# Patient Record
Sex: Male | Born: 1935 | Race: White | Hispanic: No | Marital: Married | Smoking: Former smoker
Health system: Southern US, Community
[De-identification: ages and names within clinical notes are randomized; demographics above are authoritative.]

## PROBLEM LIST (undated history)

## (undated) DIAGNOSIS — I509 Heart failure, unspecified: Secondary | ICD-10-CM

## (undated) DIAGNOSIS — R609 Edema, unspecified: Secondary | ICD-10-CM

## (undated) DIAGNOSIS — I4891 Unspecified atrial fibrillation: Secondary | ICD-10-CM

## (undated) DIAGNOSIS — I359 Nonrheumatic aortic valve disorder, unspecified: Secondary | ICD-10-CM

## (undated) DIAGNOSIS — I10 Essential (primary) hypertension: Secondary | ICD-10-CM

## (undated) DIAGNOSIS — C3490 Malignant neoplasm of unspecified part of unspecified bronchus or lung: Secondary | ICD-10-CM

## (undated) DIAGNOSIS — D693 Immune thrombocytopenic purpura: Secondary | ICD-10-CM

## (undated) DIAGNOSIS — I2510 Atherosclerotic heart disease of native coronary artery without angina pectoris: Secondary | ICD-10-CM

## (undated) DIAGNOSIS — C61 Malignant neoplasm of prostate: Secondary | ICD-10-CM

## (undated) DIAGNOSIS — E785 Hyperlipidemia, unspecified: Secondary | ICD-10-CM

## (undated) HISTORY — DX: Nonrheumatic aortic valve disorder, unspecified: I35.9

## (undated) HISTORY — DX: Heart failure, unspecified: I50.9

## (undated) HISTORY — DX: Immune thrombocytopenic purpura: D69.3

## (undated) HISTORY — PX: PNEUMONECTOMY: SHX168

## (undated) HISTORY — PX: CARPAL TUNNEL RELEASE: SHX101

## (undated) HISTORY — DX: Unspecified atrial fibrillation: I48.91

## (undated) HISTORY — DX: Essential (primary) hypertension: I10

## (undated) HISTORY — DX: Malignant neoplasm of unspecified part of unspecified bronchus or lung: C34.90

## (undated) HISTORY — DX: Hyperlipidemia, unspecified: E78.5

## (undated) HISTORY — DX: Edema, unspecified: R60.9

## (undated) HISTORY — PX: PROSTATECTOMY: SHX69

## (undated) HISTORY — DX: Atherosclerotic heart disease of native coronary artery without angina pectoris: I25.10

---

## 1998-09-21 ENCOUNTER — Encounter: Payer: Self-pay | Admitting: Family Medicine

## 1998-09-21 ENCOUNTER — Ambulatory Visit (HOSPITAL_COMMUNITY): Admission: RE | Admit: 1998-09-21 | Discharge: 1998-09-21 | Payer: Self-pay | Admitting: Family Medicine

## 1999-03-07 ENCOUNTER — Encounter: Payer: Self-pay | Admitting: Thoracic Surgery

## 1999-03-07 ENCOUNTER — Encounter: Admission: RE | Admit: 1999-03-07 | Discharge: 1999-03-07 | Payer: Self-pay | Admitting: Thoracic Surgery

## 1999-04-22 ENCOUNTER — Other Ambulatory Visit: Admission: RE | Admit: 1999-04-22 | Discharge: 1999-04-22 | Payer: Self-pay | Admitting: Urology

## 1999-07-08 ENCOUNTER — Encounter: Payer: Self-pay | Admitting: Thoracic Surgery

## 1999-07-08 ENCOUNTER — Encounter: Admission: RE | Admit: 1999-07-08 | Discharge: 1999-07-08 | Payer: Self-pay | Admitting: Thoracic Surgery

## 2000-01-04 ENCOUNTER — Encounter: Payer: Self-pay | Admitting: Thoracic Surgery

## 2000-01-04 ENCOUNTER — Encounter: Admission: RE | Admit: 2000-01-04 | Discharge: 2000-01-04 | Payer: Self-pay | Admitting: Thoracic Surgery

## 2004-10-21 ENCOUNTER — Encounter: Admission: RE | Admit: 2004-10-21 | Discharge: 2004-10-21 | Payer: Self-pay | Admitting: Family Medicine

## 2004-11-14 ENCOUNTER — Ambulatory Visit (HOSPITAL_COMMUNITY): Admission: RE | Admit: 2004-11-14 | Discharge: 2004-11-14 | Payer: Self-pay | Admitting: Family Medicine

## 2004-12-01 ENCOUNTER — Inpatient Hospital Stay (HOSPITAL_COMMUNITY): Admission: RE | Admit: 2004-12-01 | Discharge: 2004-12-14 | Payer: Self-pay | Admitting: Urology

## 2004-12-01 ENCOUNTER — Ambulatory Visit: Payer: Self-pay | Admitting: Internal Medicine

## 2004-12-01 ENCOUNTER — Encounter (INDEPENDENT_AMBULATORY_CARE_PROVIDER_SITE_OTHER): Payer: Self-pay | Admitting: *Deleted

## 2004-12-05 ENCOUNTER — Ambulatory Visit: Payer: Self-pay | Admitting: Cardiology

## 2004-12-06 ENCOUNTER — Encounter: Payer: Self-pay | Admitting: Cardiology

## 2005-07-30 IMAGING — CR DG ABDOMEN ACUTE W/ 1V CHEST
4 series · 4 of 4 positions shown · non-contrast
Comparison: none

CLINICAL DATA: BPH.  Bladder outlet obstruction.  Nausea and vomiting. 
ACUTE ABDOMEN - 2 VIEW WITH CHEST - 1 VIEW:
Supine and upright views show nasogastric tube in the fundus, coiled.  There is gaseous distention of the colon, particularly in the right colon and transverse colon.  There is a catheter projecting over the central pelvis, presumably related to the urinary tract. Small bowel gas pattern is unremarkable.  No worrisome calcifications. Ordinary degenerative changes affect the lumbar spine. 
The patient has had previous pneumonectomy on the right.  Opacification of the right hemithorax is seen, as expected.  The left chest is clear. No sign of free air.

[view not recorded (1 of 4)]
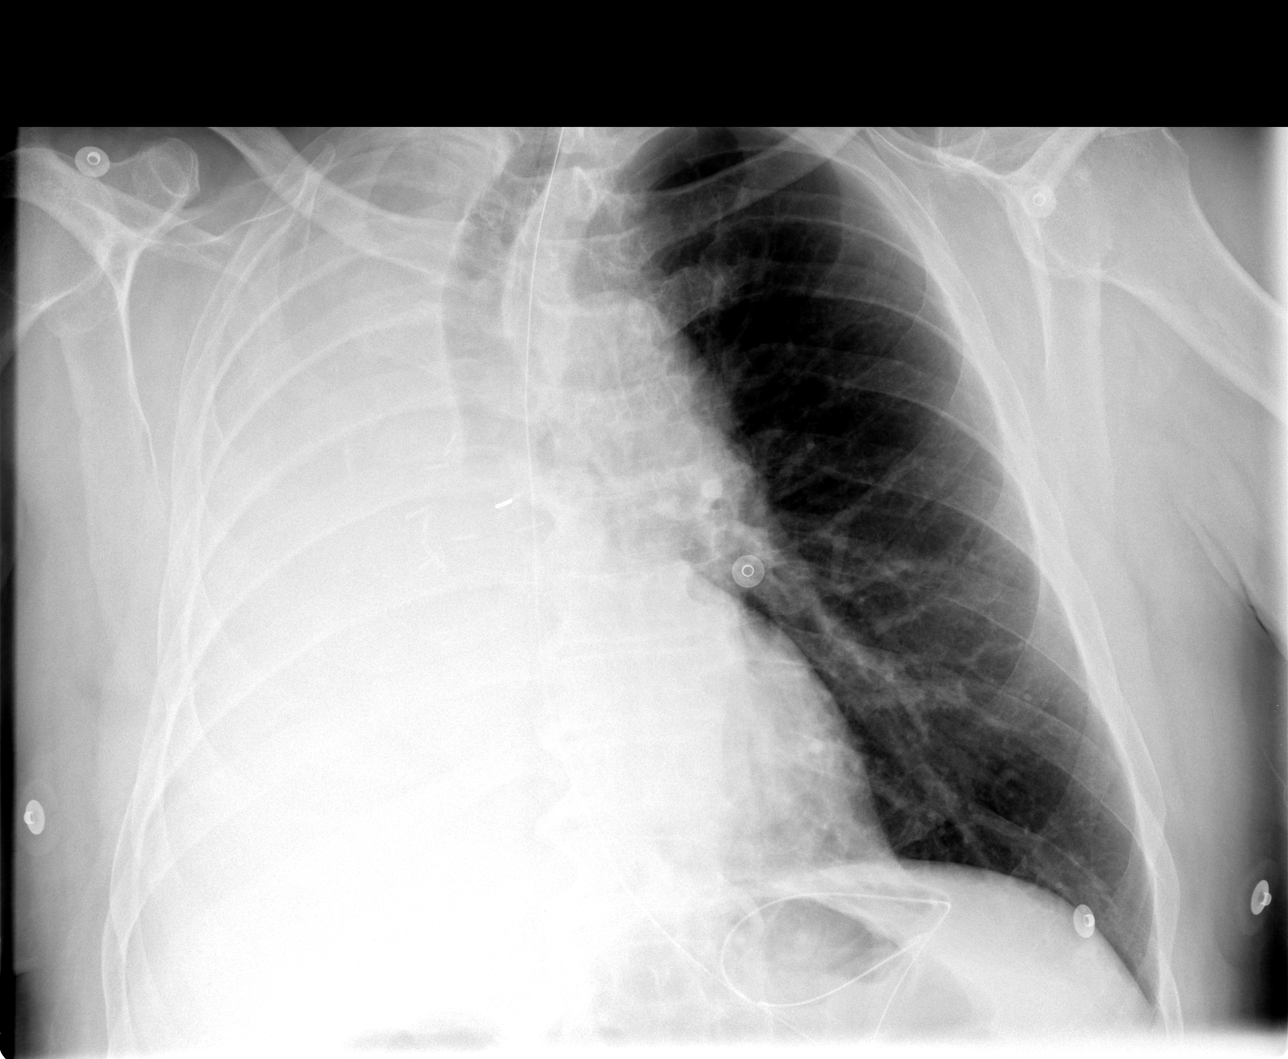

[view not recorded (2 of 4)]
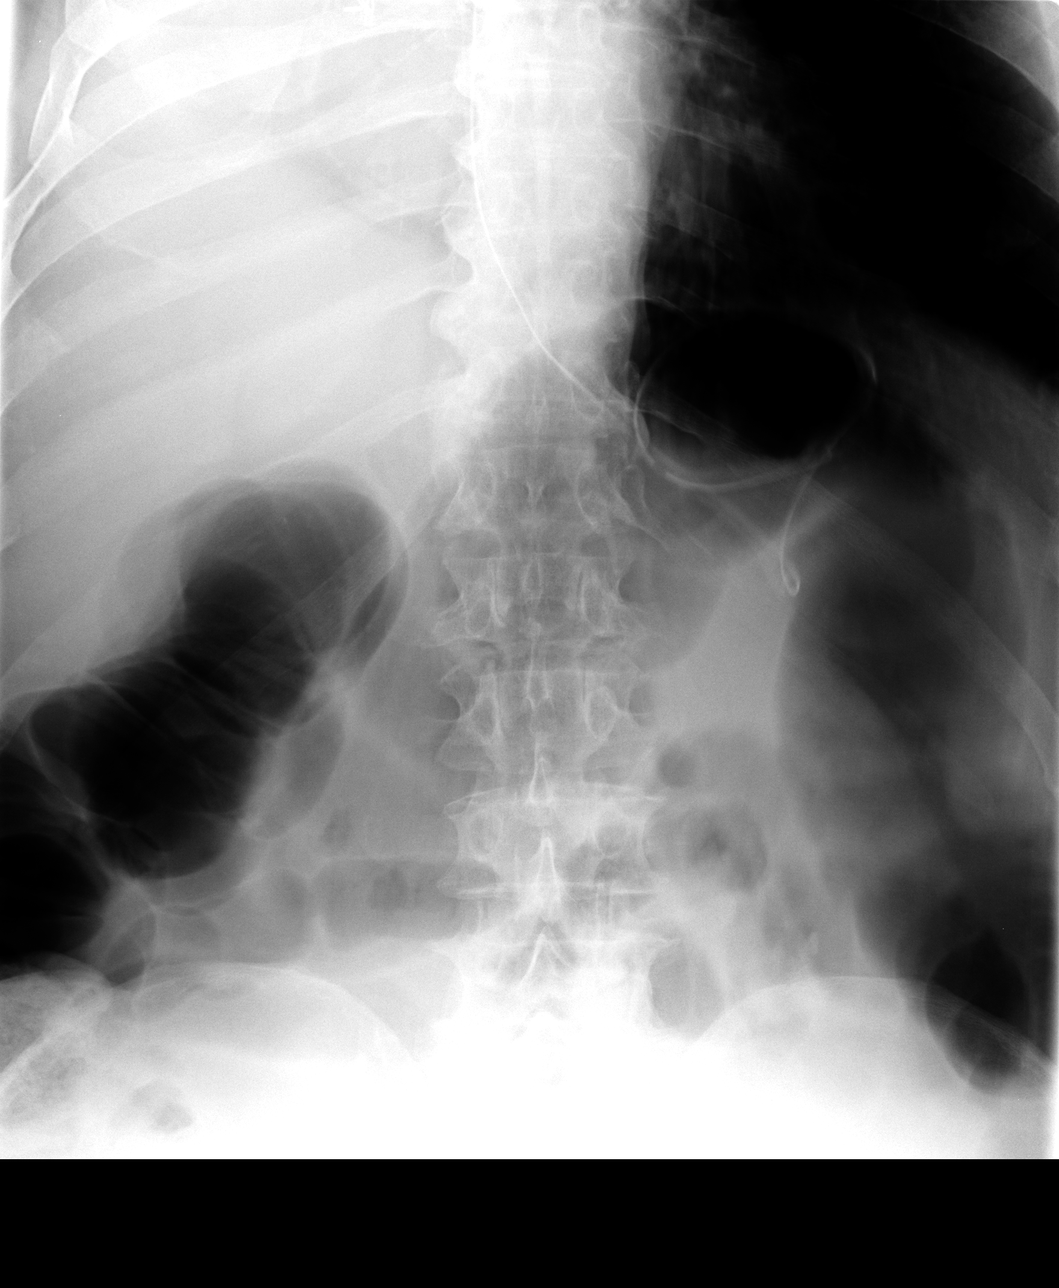

[view not recorded (3 of 4)]
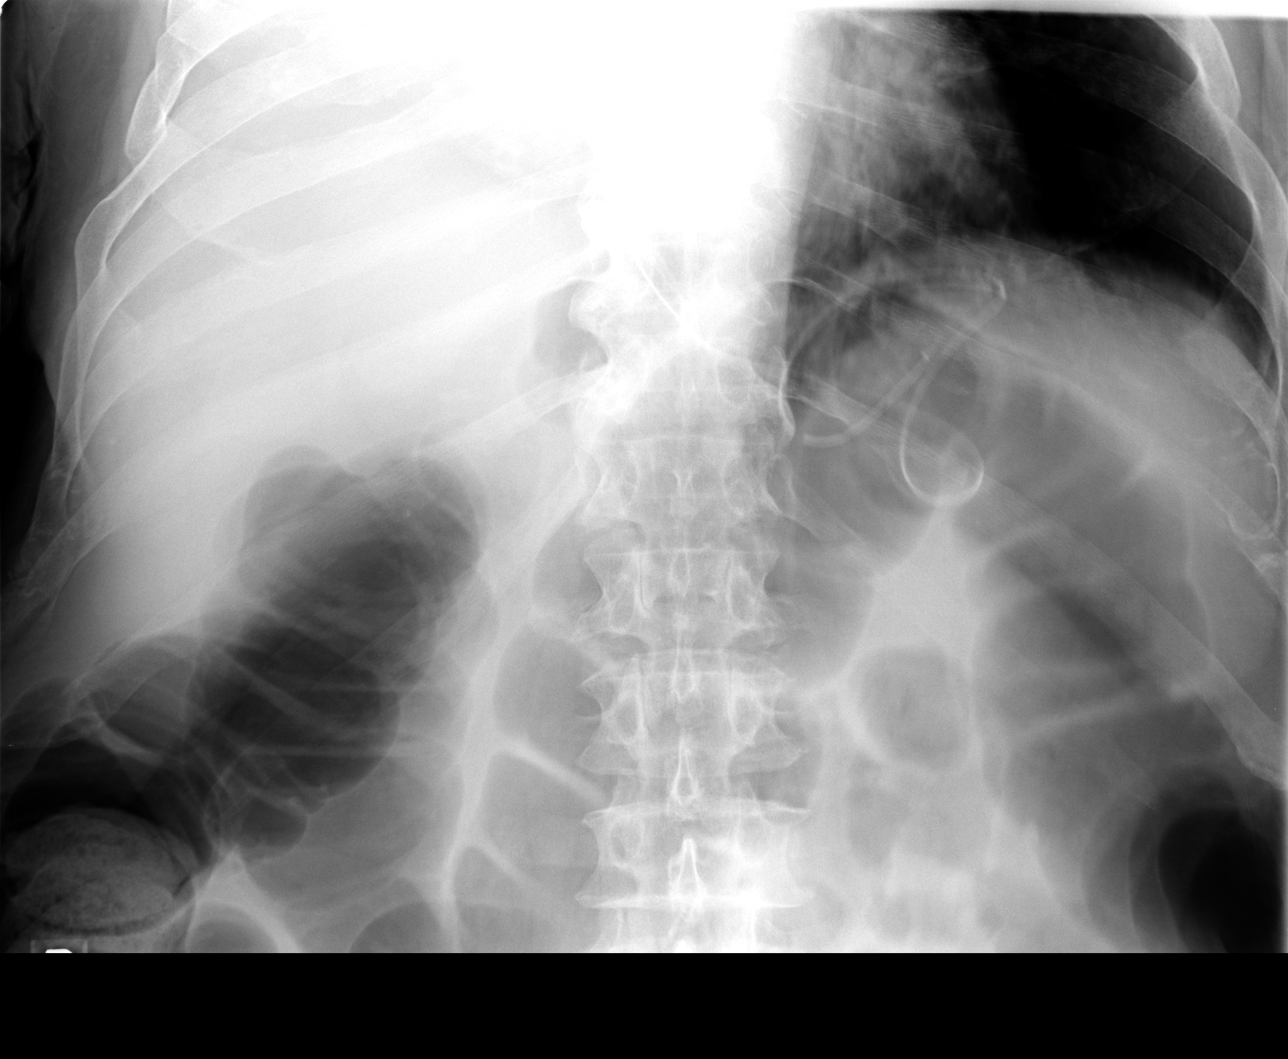

[view not recorded (4 of 4)]
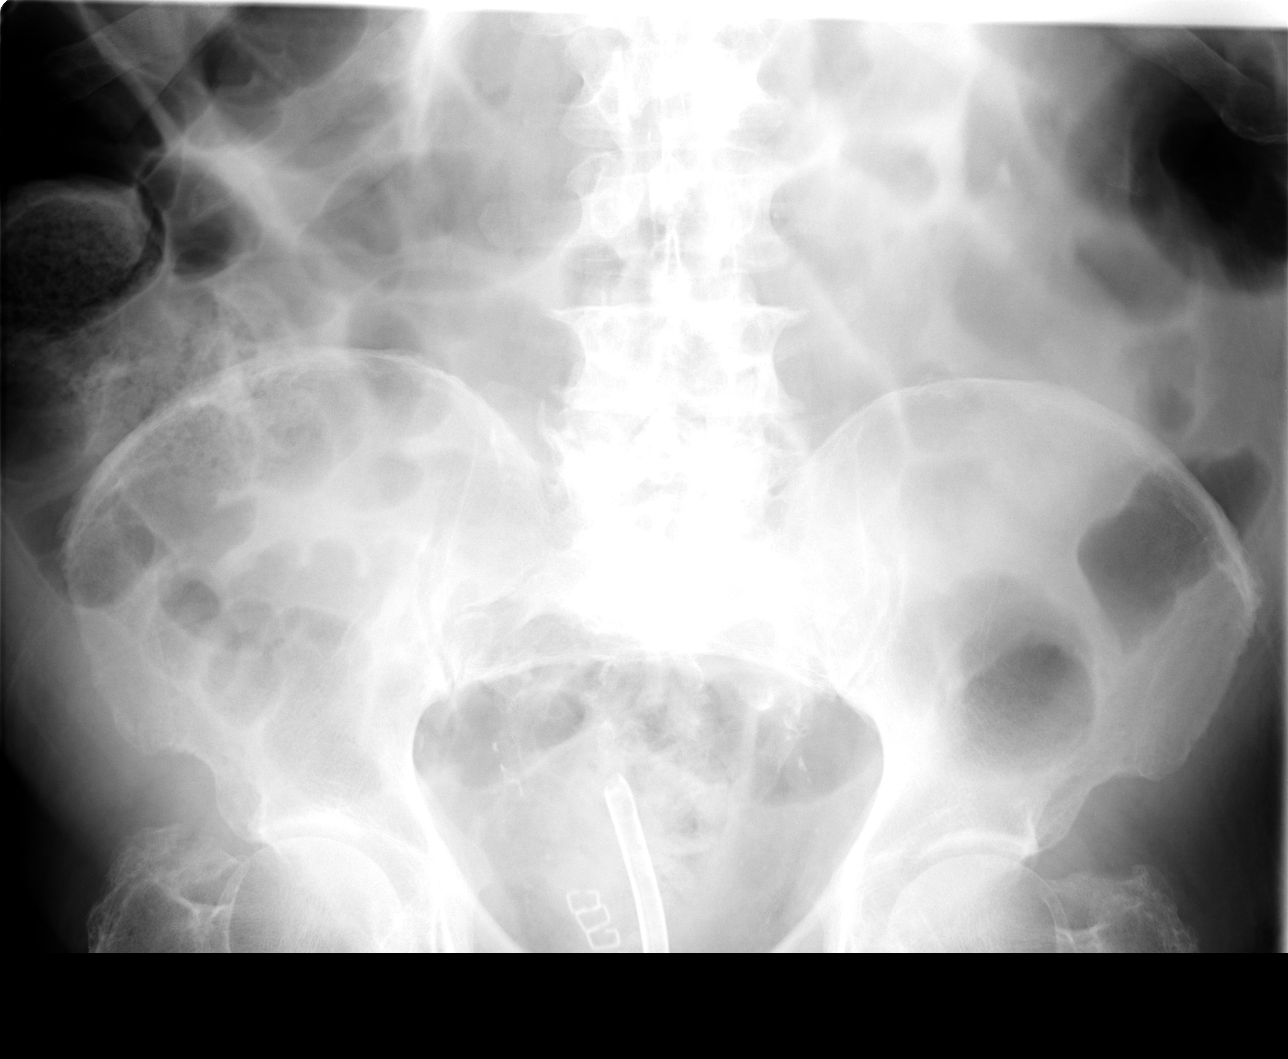

[4 of 4 positions shown; findings below may reference images not displayed]

IMPRESSION: 1.  Increased gas in the colon.  Possible colonic ileus. 
2.  Expected right pneumonectomy changes.

## 2005-08-04 IMAGING — CT CT CHEST W/ CM
1 of 2 series · 15 of 32 positions shown, 19 images · IV contrast (omnipaque)
Comparison: none

CLINICAL DATA: Fever of unknown origin.  Previous right pneumonectomy for squamous cell carcinoma.  Abnormal chest radiograph. 
 CHEST CT WITH CONTRAST:
TECHNIQUE: Multidetector CT imaging of the chest was performed following the standard protocol during bolus administration of intravenous contrast.
 Contrast:  80 cc Omnipaque 300
 Postsurgical changes are seen from right pneumonectomy with fluid filling the right pneumonectomy space.  There is no evidence of soft tissue mass in the right hemithorax or involving the right chest wall.  
 A small left pleural effusion is present as well as compensatory hyperinflation of the left lung.  The left lung is clear.  There is no evidence of pulmonary infiltrate or mass.  
 There is no evidence of hilar or mediastinal masses.  A 9 mm mediastinal lymph node is noted in the right paratracheal region.  Coronary artery calcification is noted.  
 Images obtained through the upper abdomen show the presence of small calcified gallstones but are otherwise unremarkable.

[Series 2: chest_routine 5.0 b40f st · axial · 0.78mm/px · z∈[-408,-83]mm · 15 of 75 slices shown, 19 images]
[im 5/75  mediastinal]
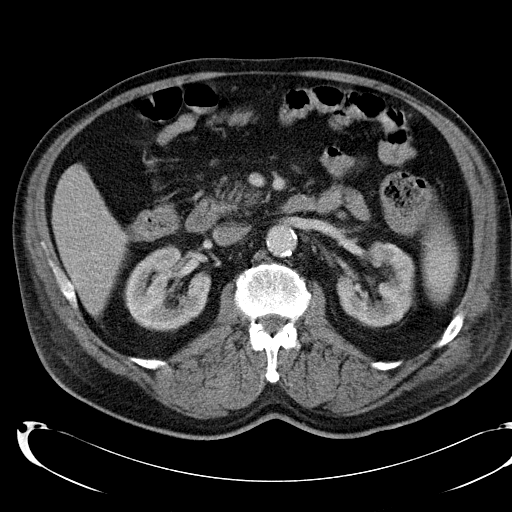
[im 5/75  lung]
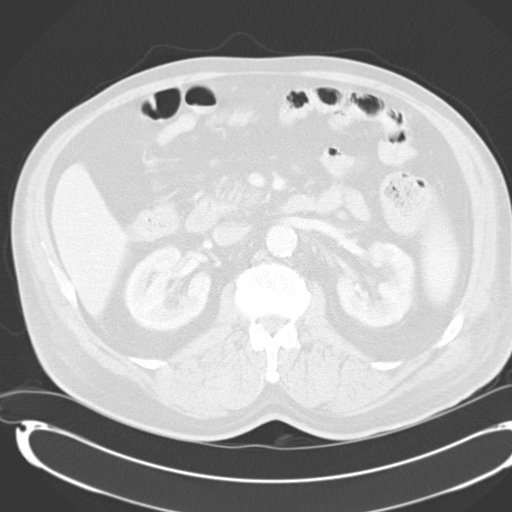
[im 10/75  lung]
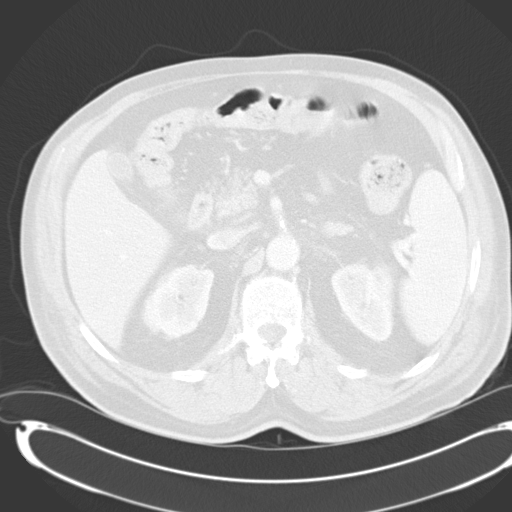
[im 15/75  lung]
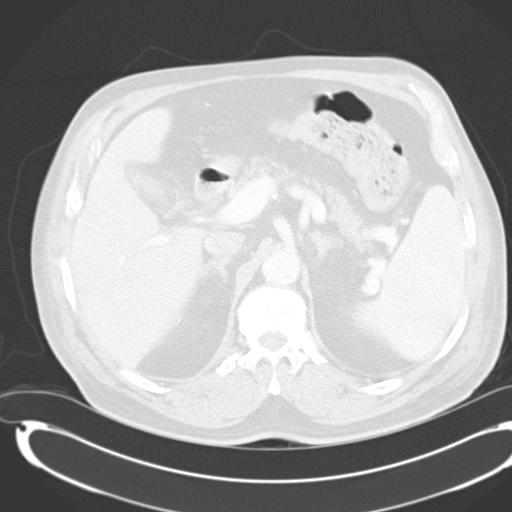
[im 20/75  lung]
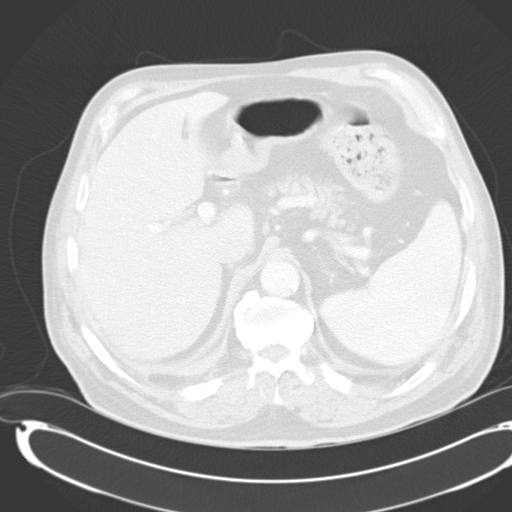
[im 25/75  mediastinal]
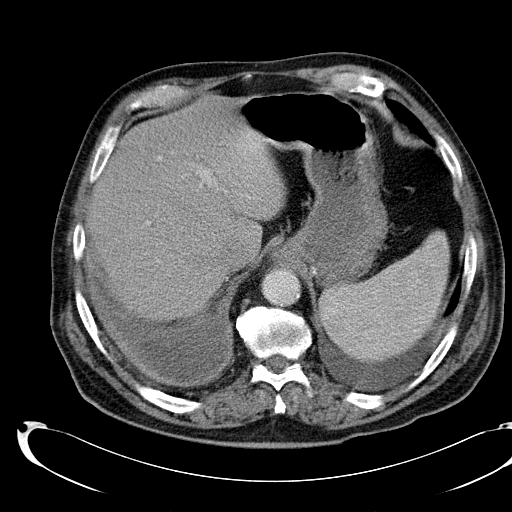
[im 25/75  lung]
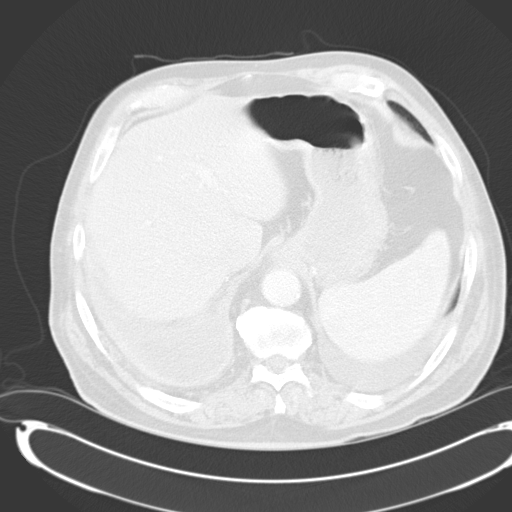
[im 30/75  lung]
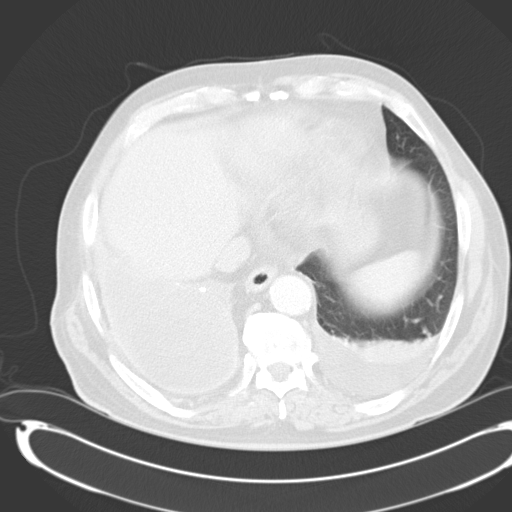
[im 35/75  lung]
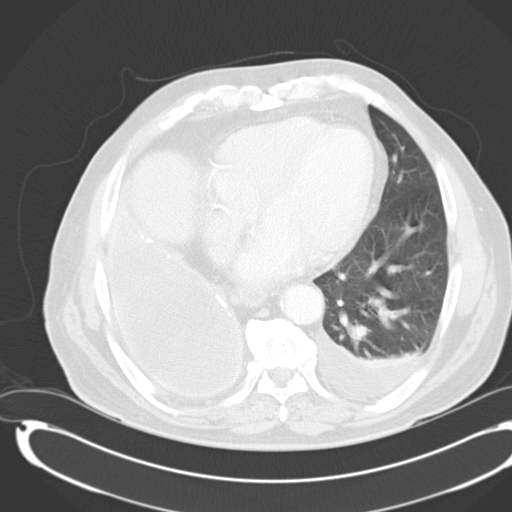
[im 38/75  lung]
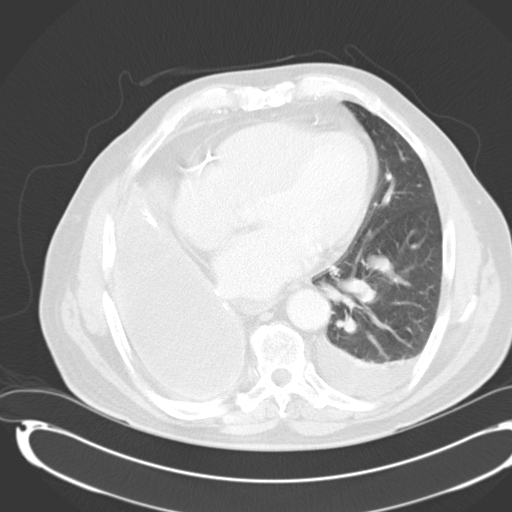
[im 40/75  mediastinal]
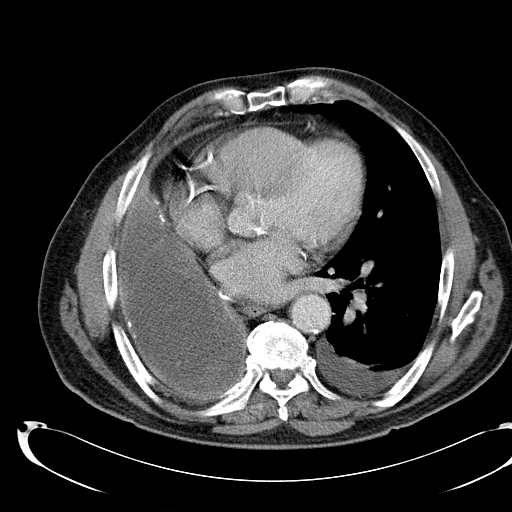
[im 40/75  lung]
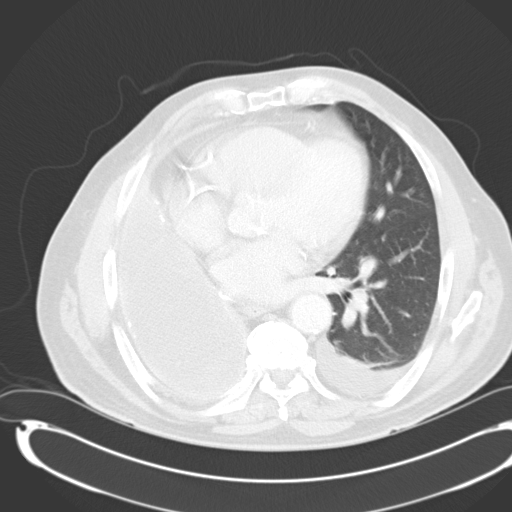
[im 45/75  lung]
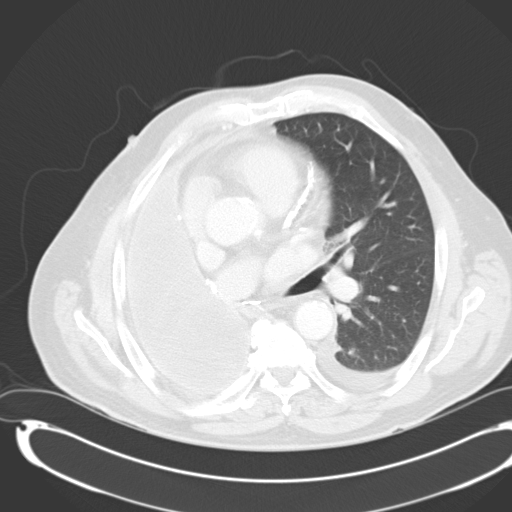
[im 50/75  lung]
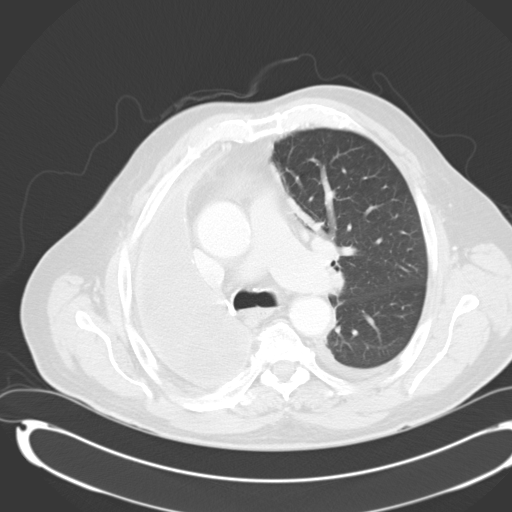
[im 55/75  lung]
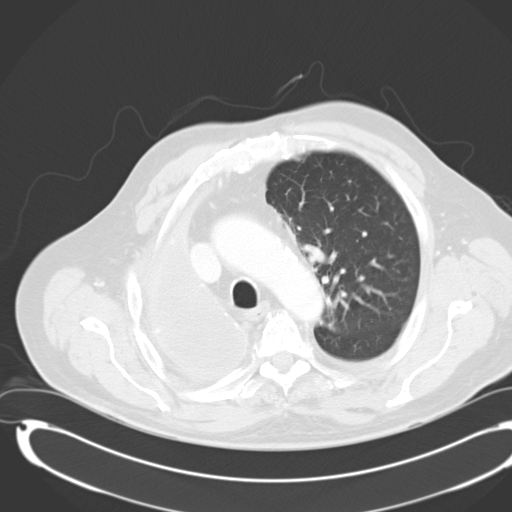
[im 60/75  mediastinal]
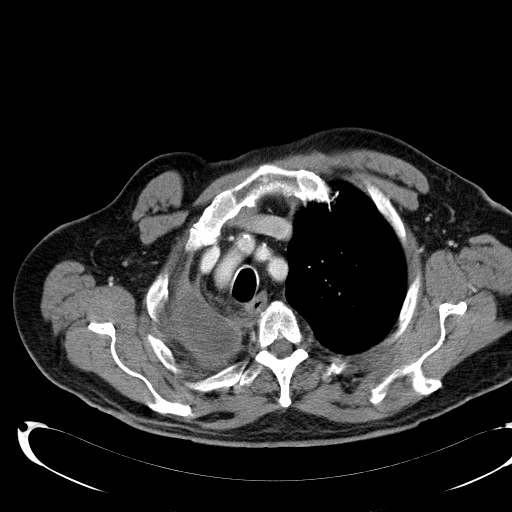
[im 60/75  lung]
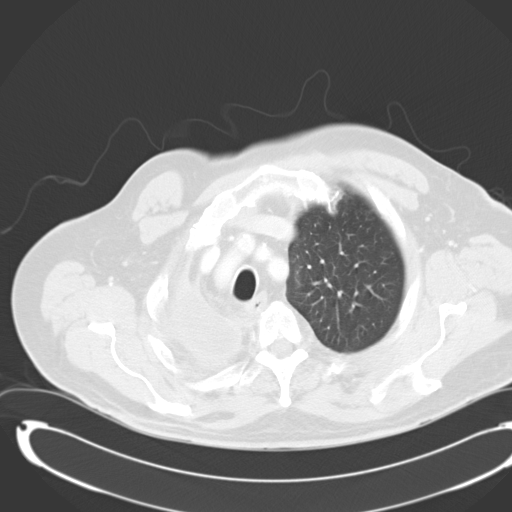
[im 65/75  lung]
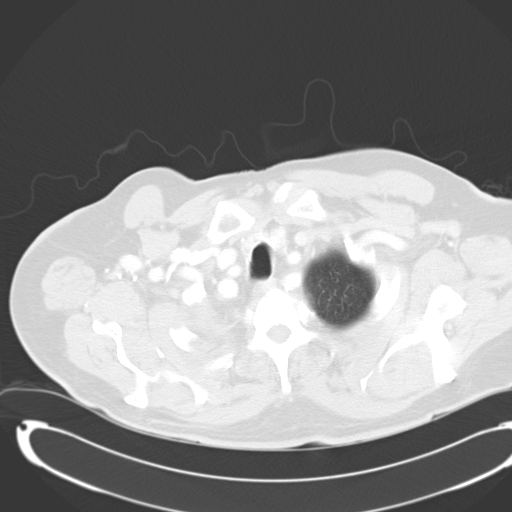
[im 70/75  lung]
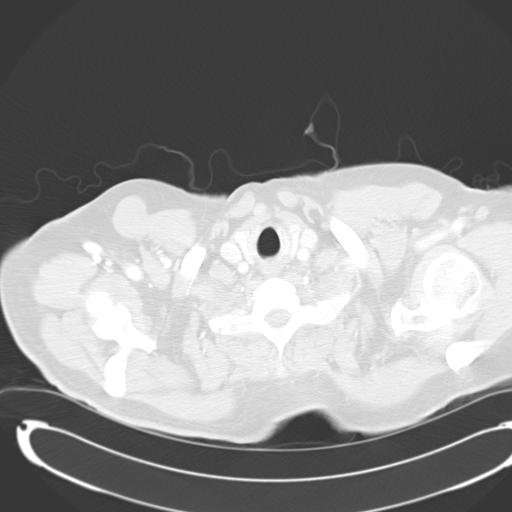

[15 of 32 positions shown; findings below may reference images not displayed]

IMPRESSION: 1.  Postoperative changes from previous right pneumonectomy. 
 2.  Small left pleural effusion of uncertain etiology.  
 3.  No definite evidence of recurrent or metastatic tumor or other acute findings.  
 4.  Cholelithiasis again noted.

## 2005-09-04 ENCOUNTER — Observation Stay (HOSPITAL_COMMUNITY): Admission: EM | Admit: 2005-09-04 | Discharge: 2005-09-06 | Payer: Self-pay | Admitting: Emergency Medicine

## 2009-05-28 DIAGNOSIS — E785 Hyperlipidemia, unspecified: Secondary | ICD-10-CM

## 2009-05-28 DIAGNOSIS — M129 Arthropathy, unspecified: Secondary | ICD-10-CM

## 2009-05-28 DIAGNOSIS — I10 Essential (primary) hypertension: Secondary | ICD-10-CM | POA: Insufficient documentation

## 2009-05-28 DIAGNOSIS — I4891 Unspecified atrial fibrillation: Secondary | ICD-10-CM | POA: Insufficient documentation

## 2009-05-28 DIAGNOSIS — I2510 Atherosclerotic heart disease of native coronary artery without angina pectoris: Secondary | ICD-10-CM

## 2009-06-09 ENCOUNTER — Ambulatory Visit: Payer: Self-pay | Admitting: Cardiology

## 2009-06-09 DIAGNOSIS — I5022 Chronic systolic (congestive) heart failure: Secondary | ICD-10-CM | POA: Insufficient documentation

## 2009-06-28 ENCOUNTER — Ambulatory Visit: Payer: Self-pay | Admitting: Internal Medicine

## 2009-06-28 ENCOUNTER — Ambulatory Visit (HOSPITAL_COMMUNITY): Admission: RE | Admit: 2009-06-28 | Discharge: 2009-06-28 | Payer: Self-pay | Admitting: Cardiology

## 2009-06-28 ENCOUNTER — Encounter: Payer: Self-pay | Admitting: Cardiology

## 2009-06-28 ENCOUNTER — Ambulatory Visit: Payer: Self-pay

## 2009-07-12 ENCOUNTER — Encounter: Payer: Self-pay | Admitting: Cardiology

## 2009-07-14 ENCOUNTER — Ambulatory Visit: Payer: Self-pay | Admitting: Cardiology

## 2009-07-14 DIAGNOSIS — I359 Nonrheumatic aortic valve disorder, unspecified: Secondary | ICD-10-CM | POA: Insufficient documentation

## 2009-07-14 DIAGNOSIS — R609 Edema, unspecified: Secondary | ICD-10-CM

## 2009-10-06 ENCOUNTER — Telehealth (INDEPENDENT_AMBULATORY_CARE_PROVIDER_SITE_OTHER): Payer: Self-pay | Admitting: *Deleted

## 2010-05-31 ENCOUNTER — Encounter
Admission: RE | Admit: 2010-05-31 | Discharge: 2010-05-31 | Payer: Self-pay | Source: Home / Self Care | Attending: Orthopedic Surgery | Admitting: Orthopedic Surgery

## 2010-06-01 LAB — BASIC METABOLIC PANEL
BUN: 10 mg/dL (ref 6–23)
CO2: 31 mEq/L (ref 19–32)
Calcium: 9.8 mg/dL (ref 8.4–10.5)
Chloride: 99 mEq/L (ref 96–112)
Creatinine, Ser: 0.97 mg/dL (ref 0.4–1.5)
GFR calc Af Amer: >60 mL/min (ref >60)
GFR calc non Af Amer: >60 mL/min (ref >60)
Glucose, Bld: 128 mg/dL — ABNORMAL HIGH (ref 70–99)
Potassium: 4.9 mEq/L (ref 3.5–5.1)
Sodium: 140 mEq/L (ref 135–145)

## 2010-06-03 ENCOUNTER — Ambulatory Visit
Admission: RE | Admit: 2010-06-03 | Discharge: 2010-06-03 | Payer: Self-pay | Source: Home / Self Care | Attending: Orthopedic Surgery | Admitting: Orthopedic Surgery

## 2010-06-06 LAB — POCT HEMOGLOBIN-HEMACUE: Hemoglobin: 14.2 g/dL (ref 13.0–17.0)

## 2010-06-10 NOTE — Op Note (Signed)
NAME:  Craig Dean, Craig Dean NO.:  [id number]  MEDICAL RECORD NO.:  [id number]          PATIENT TYPE:  AMB  LOCATION:  DSC                          FACILITY:  [facility]  PHYSICIAN:  Katy Fitch. Sypher, M.D. DATE OF BIRTH:  [birth year]  DATE OF PROCEDURE:  06/03/2010 DATE OF DISCHARGE:                              OPERATIVE REPORT   PREOPERATIVE DIAGNOSIS:  Severe left carpal tunnel syndrome, chronic  POSTOPERATIVE DIAGNOSIS:  Severe left carpal tunnel syndrome, chronic  OPERATIONS:  Release of left transverse carpal ligament.  OPERATING SURGEON:  Katy Fitch. Sypher, MD  ASSISTANT:  Marveen Reeks Dasnoit, PA-C  ANESTHESIA:  General by LMA.  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 Rudi Heap of [place], [place] for management of bilateral carpal tunnel syndrome.  Craig Dean has a past medical history with multiple issues including right pneumonectomy for lung cancer 1998, prostatectomy 2006, history of pilonidal cyst x2.  He is a former smoker, quitting in 1984.  He was referred due to hand numbness for electrodiagnostic studies at [facility] on May 05, 2010.  These revealed evidence of severe bilateral carpal tunnel syndrome, left worse than right.  He was subsequently referred for an upper extremity orthopedic consult. Clinical examination revealed signs of advanced carpal tunnel syndrome with a mild thenar atrophy.  He had diminished sweating in the median distribution.  We advised him to proceed with release of the left transverse carpal ligament under general or regional anesthesia.  He had a detailed consult with Dr. Gypsy Balsam, and was advised to proceed with general anesthesia by LMA technique.  After informed consent with Dr. Gypsy Balsam, questions invited, answered detail.  He was brought to the operating room at this time.  PROCEDURE:   Craig Dean was brought to room 1 of the [facility] and placed in supine position on the operating table.  Following the induction of general anesthesia by LMA technique, the left arm was prepped with Betadine soap solution and sterilely draped. Following routine surgical time-out, the left arm was exsanguinated with an Esmarch bandage, an arterial tourniquet on the proximal brachium inflated to 250 mmHg.  Procedure commenced with a short incision in line of the ring finger of the palm.  Subcutaneous tissue was carefully divided on the palmar fascia.  This split longitudinally to reveal the common sensory branch of the median nerve.  The branches were followed back to the median nerve proper, which was then gently isolated from the deep surface of the transverse carpal ligament with a Insurance risk surveyor.  The transverse carpal ligament and volar forearm fascia were then released subcutaneously with scissors proximally into the distal forearm along the ulnar aspect of the ligament.  This widely opened the carpal canal.  The median nerve was hyperemic and bruised beneath the transverse carpal ligament.  The wound was inspected for bleeding points, which were electrocauterized by bipolar current followed by repair of the skin with intradermal 3-0 Prolene suture.  A compressive dressing was applied with a volar plaster splint maintaining the  wrist at 5 degrees of dorsiflexion.  For aftercare, Craig Dean is provided a prescription for Percocet 5 mg 1 p.o. q.4-6 h. p.r.n. pain 20 tablets without refill.  We will see him back for followup in our office in 1 week.     Katy Fitch Sypher, M.D.     RVS/MEDQ  D:  06/03/2010  T:  06/04/2010  Job:  [id number]  cc:   Ernestina Penna, M.D.  Electronically Signed by Josephine Igo M.D. on 06/08/2010 08:01:25 AM

## 2010-06-14 NOTE — Miscellaneous (Signed)
  Clinical Lists Changes  Observations: Added new observation of ECHOINTERP:    - Left ventricle: The cavity size was mildly dilated. The estimated       ejection fraction was 45%. Diffuse hypokinesis.     - Aortic valve: There was mild to moderate stenosis. Trivial       regurgitation.     - Mitral valve: Calcified annulus. Mild regurgitation.     - Atrial septum: There was increased thickness of the septum,       consistent with lipomatous hypertrophy. (06/28/2009 8:39)      Echocardiogram  Procedure date:  06/28/2009  Findings:         - Left ventricle: The cavity size was mildly dilated. The estimated       ejection fraction was 45%. Diffuse hypokinesis.     - Aortic valve: There was mild to moderate stenosis. Trivial       regurgitation.     - Mitral valve: Calcified annulus. Mild regurgitation.     - Atrial septum: There was increased thickness of the septum,       consistent with lipomatous hypertrophy.

## 2010-06-14 NOTE — Assessment & Plan Note (Signed)
Summary: Craig Dean   Visit Type:  Follow-up Primary Provider:  Dr. Christell Constant  CC:  CAD.  History of Present Illness: The patient presents for followup. It has been several years since I last saw him. He has a history of a distant myocardial infarction. In 2002 when he had his last stress perfusion study and was evidence of an old inferior infarct with a mildly reduced ejection fraction of about 40%. From a cardiovascular standpoint he has had no problems. He has been battling ITP. He denies any acute cardiovascular symptoms such as palpitations, presyncope or syncope. He has no chest pressure, neck or arm discomfort. He does get short of breath with moderate activity but this has been since treatment for lung cancer. He is not describing PND or orthopnea. He does report lower extremity edema which is evident today. He says this is new over the past week or so. He did just complete a course of dexamethasone for his ITP.  Current Medications (verified): 1)  Enalapril Maleate 20 Mg Tabs (Enalapril Maleate) .Marland Kitchen.. 1 By Mouth Daily 2)  Taztia Xt 240 Mg Xr24h-Cap (Diltiazem Hcl Er Beads) .Marland Kitchen.. 1 By Mouth Daily 3)  Aspirin 81 Mg  Tabs (Aspirin) .Marland Kitchen.. 1 By Mouth Daily 4)  Vitamin D 1000 Unit Tabs (Cholecalciferol) .Marland Kitchen.. 1 By Mouth Two Times A Day  Allergies (verified): 1)  ! Penicillin  Past History:  Past Medical History:  1.  Coronary artery disease. (Inferio MI 1984. Stress perfusion study 02.  EF 40%)  2.  Hyperlipidemia.  3.  Lung cancer, status post right pneumonectomy in 1998, thought to be      cured.  4.  BPH, status post prostatectomy.  5.  Hypertension.  6.  Arthritis.  7.  ITP  Past Surgical History: Prostatectomy Rght pneumonectomy in 1998  Family History: Noncontributory, although his mother died in her sleep at age 7 and his father died at age 85 with end-stage Alzheimer's disease.  Social History:  He lives with wife and his mother-in-law stays with them.  He quit  smoking in 1984.  He is a retired Holiday representative.  Review of Systems       As stated in the HPI and negative for all other systems.   Vital Signs:  Patient profile:   77 year old male Height:      70 inches Weight:      200 pounds BMI:     28.80 Pulse rate:   82 / minute Resp:     16 per minute BP sitting:   152 / 68  (right arm)  Vitals Entered By: Marrion Coy, CNA (June 09, 2009 9:47 AM)  Physical Exam  General:  Well developed, well nourished, in no acute distress. Head:  normocephalic and atraumatic Eyes:  PERRLA/EOM intact; conjunctiva and lids normal. Mouth:  Edentulous. Oral mucosa normal. Neck:  Neck supple, no JVD. No masses, thyromegaly or abnormal cervical nodes. Chest Wall:  no deformities or breast masses noted Lungs:  decreased breath sounds right side, clear on the left, no crackles Abdomen:  Bowel sounds positive; abdomen soft and non-tender without masses, organomegaly, or hernias noted. No hepatosplenomegaly. Msk:  Back normal, normal gait. Muscle strength and tone normal. Extremities:  moderate bilateral left greater than right lower extremity edema to above the ankles Neurologic:  Alert and oriented x 3. Skin:  Intact without lesions or rashes. Cervical Nodes:  no significant adenopathy Axillary Nodes:  no significant adenopathy Inguinal Nodes:  no significant adenopathy Psych:  Normal affect.   Detailed Cardiovascular Exam  Neck    Carotids: Carotids full and equal bilaterally without bruits.      Neck Veins: Normal, no JVD.    Heart    Inspection: no deformities or lifts noted.      Palpation: normal PMI with no thrills palpable.      Auscultation: S1 and S2 within normal limits, no S3, no clicks, no rubs, 3/6 systolic murmur radiating out the aortic outflow tract, no diastolic murmurs.  Vascular    Abdominal Aorta: no palpable masses, pulsations, or audible bruits.      Femoral Pulses: normal femoral pulses bilaterally.        Pedal Pulses: normal pedal pulses bilaterally.      Radial Pulses: normal radial pulses bilaterally.      Peripheral Circulation: no clubbing, cyanosis, or edema noted with normal capillary refill.     Impression & Recommendations:  Problem # 1:  CHF (ICD-428.0) The patient does have a mildly reduced ejection fraction. He has a heart murmur as well. It is time to repeat an echocardiogram. I suspect that maybe changing him to a beta blocker if he tolerates once I see the results of that echocardiogram. Orders: Echocardiogram (Echo)  Problem # 2:  CAD (ICD-414.00) He is having no symptoms. We will continue his secondary risk reduction and consider further workup based on the results of the echo. Orders: Echocardiogram (Echo) EKG w/ Interpretation (93000)  Problem # 3:  HYPERTENSION (ICD-401.9) His low pressure is controlled. I might make changes as suggested above.  Problem # 4:  ATRIAL FIBRILLATION (ICD-427.31) There dimension of this. However, looking through both office charts and all available EKGs I cannot find where this was documented. He does have PVCs. No further evaluation is warranted at this point. Orders: EKG w/ Interpretation (93000)  Problem # 5:  HYPERLIPIDEMIA (ICD-272.4) This his follow closely by Dr. Christell Constant. The last LDL was 153 with an HDL of 59. I will defer to his primary physician. The obvious goal is LDL less than 100.  Patient Instructions: 1)  Your physician recommends that you schedule a follow-up appointment in: 1 month with Dr Antoine Poche in [place] 2)  Your physician recommends that you continue on your current medications as directed. Please refer to the Current Medication list given to you today. 3)  You have been diagnosed with Congestive Heart Failure or CHF.  CHF is a condition in which a problem with the structure or function of the heart impairs its ability to supply sufficient blood flow to meet the body's needs.  For further information please  visit www.cardiosmart.org for detailed information on CHF. 4)  Your physician recommends that you weigh, daily, at the same time every day, and in the same amount of clothing.  Please record your daily weights on the handout provided and bring it to your next appointment. 5)  Your physician has requested that you have an echocardiogram.  Echocardiography is a painless test that uses sound waves to create images of your heart. It provides your doctor with information about the size and shape of your heart and how well your heart's chambers and valves are working.  This procedure takes approximately one hour. There are no restrictions for this procedure.

## 2010-06-14 NOTE — Progress Notes (Signed)
  Phone Note Other Incoming   Caller: Wendy/[facility] Initial call taken by: Marijean Niemann Echo,LOV to [phone] [facility]  Oct 06, 2009 9:50 AM

## 2010-06-14 NOTE — Assessment & Plan Note (Signed)
Summary: [facility]   Visit Type:  Follow-up Primary Provider:  Dr. Christell Constant  CC:  Cardiomyopathy.  History of Present Illness: The patient presents for followup of lower extremity swelling. This was somewhat prominent at the last appointment. He also had a more prominent systolic murmur. I sent him for an echo which demonstrated his EF to be about 45% which is where it has been. He also had some mild to moderate aortic stenosis but no other significant abnormalities. He says his swelling is much less bothersome. He has had no new shortness of breath, PND or orthopnea. He has had no new palpitations, presyncope or syncope. He has had no chest, neck or arm discomfort. He's had no cough fevers or chills.  Overall he thinks he is doing relatively well. Of note he was on steroids for ITP at the time of his increased leg swelling and it was thought that this might contribute.  Current Medications (verified): 1)  Enalapril Maleate 20 Mg Tabs (Enalapril Maleate) .Marland Kitchen.. 1 By Mouth Daily 2)  Taztia Xt 240 Mg Xr24h-Cap (Diltiazem Hcl Er Beads) .Marland Kitchen.. 1 By Mouth Daily 3)  Aspirin 81 Mg  Tabs (Aspirin) .Marland Kitchen.. 1 By Mouth Daily 4)  Vitamin D 1000 Unit Tabs (Cholecalciferol) .Marland Kitchen.. 1 By Mouth Two Times A Day  Allergies (verified): 1)  ! Penicillin  Past History:  Past Medical History: Reviewed history from 06/09/2009 and no changes required.  1.  Coronary artery disease. (Inferio MI 1984. Stress perfusion study 02.  EF 40%)  2.  Hyperlipidemia.  3.  Lung cancer, status post right pneumonectomy in 1998, thought to be      cured.  4.  BPH, status post prostatectomy.  5.  Hypertension.  6.  Arthritis.  7.  ITP  Past Surgical History: Reviewed history from 06/09/2009 and no changes required. Prostatectomy Rght pneumonectomy in 1998  Review of Systems       As stated in the HPI and negative for all other systems.   Vital Signs:  Patient profile:   77 year old male Height:      70  inches Weight:      207 pounds BMI:     29.81 Pulse rate:   83 / minute Resp:     16 per minute BP sitting:   154 / 80  (right arm)  Vitals Entered By: Marrion Coy, CNA (July 14, 2009 10:09 AM)  Physical Exam  General:  Well developed, well nourished, in no acute distress. Head:  normocephalic and atraumatic Eyes:  PERRLA/EOM intact; conjunctiva and lids normal. Mouth:  Edentulous. Oral mucosa normal. Neck:  Neck supple, no JVD. No masses, thyromegaly or abnormal cervical nodes. Chest Wall:  no deformities or breast masses noted Lungs:  decreased breath sounds right side, clear on the left, no crackles Abdomen:  Bowel sounds positive; abdomen soft and non-tender without masses, organomegaly, or hernias noted. No hepatosplenomegaly. Msk:  Back normal, normal gait. Muscle strength and tone normal. Extremities:  moderate bilateral left greater than right lower extremity edema to above the ankles Neurologic:  Alert and oriented x 3. Skin:  Intact without lesions or rashes. Cervical Nodes:  no significant adenopathy Axillary Nodes:  no significant adenopathy Inguinal Nodes:  no significant adenopathy Psych:  Normal affect.   Detailed Cardiovascular Exam  Neck    Carotids: Carotids full and equal bilaterally without bruits.      Neck Veins: Normal, no JVD.    Heart    Inspection: no deformities  or lifts noted.      Palpation: normal PMI with no thrills palpable.      Auscultation: S1 and S2 within normal limits, no S3, no clicks, no rubs, 3/6 systolic murmur radiating out the aortic outflow tract, no diastolic murmurs.  Vascular    Abdominal Aorta: no palpable masses, pulsations, or audible bruits.      Femoral Pulses: normal femoral pulses bilaterally.      Pedal Pulses: normal pedal pulses bilaterally.      Radial Pulses: normal radial pulses bilaterally.      Peripheral Circulation: no clubbing, cyanosis, or edema noted with normal capillary refill.     Impression &  Recommendations:  Problem # 1:  EDEMA (ICD-782.3) His edema is improved. He should continue with conservative strategies to manage this.  Problem # 2:  CHF (ICD-428.0) His ejection fraction is unchanged. At this point I will titrate his medicines for better blood pressure control as described below but do not think he needs further titration specifically for heart failure. I don't believe he has any left-sided failure.  Problem # 3:  HYPERTENSION (ICD-401.9)  His blood pressure is elevated and has been on multiple appointments. I will increase his Cardizem to 300 mg daily. He will keep a watch on his blood pressures at home.  His updated medication list for this problem includes:    Enalapril Maleate 20 Mg Tabs (Enalapril maleate) .Marland Kitchen... 1 by mouth daily    Taztia Xt 300 Mg Xr24h-cap (Diltiazem hcl er beads) ..... One daily    Aspirin 81 Mg Tabs (Aspirin) .Marland Kitchen... 1 by mouth daily  Problem # 4:  CAD (ICD-414.00) He  will continue with risk reduction.  Patient Instructions: 1)  Your physician recommends that you schedule a follow-up appointment in: 1 YEAR IN [place] 2)  Your physician has recommended you make the following change in your medication: INCREASE DILTIAZEM TO 300 MG A DAY Prescriptions: TAZTIA XT 300 MG XR24H-CAP (DILTIAZEM HCL ER BEADS) ONE DAILY  #30 x 11   Entered by:   Charolotte Capuchin, RN   Authorized by:   Rollene Rotunda, MD, [facility]   Signed by:   Charolotte Capuchin, RN on 07/14/2009   Method used:   Electronically to        [facility] [phone]* (retail)       [address]       [place], [place]  [address]       Ph: [phone] or [phone]       Fax: [phone]   RxID:   [id number]

## 2010-08-31 ENCOUNTER — Encounter: Payer: Self-pay | Admitting: Cardiology

## 2010-08-31 ENCOUNTER — Ambulatory Visit (INDEPENDENT_AMBULATORY_CARE_PROVIDER_SITE_OTHER): Payer: Medicare Other | Admitting: Cardiology

## 2010-08-31 VITALS — BP 170/85 | HR 78 | Ht 70.0 in | Wt 205.0 lb

## 2010-08-31 DIAGNOSIS — E785 Hyperlipidemia, unspecified: Secondary | ICD-10-CM

## 2010-08-31 DIAGNOSIS — I509 Heart failure, unspecified: Secondary | ICD-10-CM

## 2010-08-31 DIAGNOSIS — I4891 Unspecified atrial fibrillation: Secondary | ICD-10-CM

## 2010-08-31 DIAGNOSIS — I10 Essential (primary) hypertension: Secondary | ICD-10-CM

## 2010-08-31 DIAGNOSIS — I359 Nonrheumatic aortic valve disorder, unspecified: Secondary | ICD-10-CM

## 2010-08-31 DIAGNOSIS — I2510 Atherosclerotic heart disease of native coronary artery without angina pectoris: Secondary | ICD-10-CM

## 2010-08-31 DIAGNOSIS — M129 Arthropathy, unspecified: Secondary | ICD-10-CM

## 2010-08-31 MED ORDER — ENALAPRIL MALEATE 20 MG PO TABS: 20.0000 mg | ORAL_TABLET | Freq: Two times a day (BID) | ORAL | Status: DC

## 2010-08-31 NOTE — Assessment & Plan Note (Signed)
His blood pressure is not at target. I reviewed previous readings. I will increase his enalapril to 20 mg b.i.d.  He will keep a blood pressure diary and will present this to Dr. Christell Constant for further med titration.

## 2010-08-31 NOTE — Progress Notes (Signed)
HPI Patient presents for followup of mild cardiomyopathy aortic stenosis and hypertension. It has been one year since I last saw him. He has had no acute cardiovascular complaints. He still remains active around his yard and with this he denies any new shortness of breath. He denies any PND or orthopnea. He does not describe any new palpitations, presyncope or syncope. There has been no chest pressure, neck or arm discomfort. He has had no weight gain or edema. He did recently have carpal tunnel surgery and did well with this though he still having some limitations with left hand stiffness.  Allergies  Allergen Reactions  . Penicillins     Current Outpatient Prescriptions  Medication Sig Dispense Refill  . acetaminophen (TYLENOL ARTHRITIS PAIN) 650 MG CR tablet Take 650 mg by mouth every 8 (eight) hours as needed.        Marland Kitchen aspirin 81 MG tablet Take 81 mg by mouth daily.        . cholecalciferol (VITAMIN D) 1000 UNITS tablet Take 1,000 Units by mouth 2 (two) times daily.        Marland Kitchen diltiazem (TIAZAC) 300 MG 24 hr capsule Take 300 mg by mouth daily.        . enalapril (VASOTEC) 20 MG tablet Take 1 tablet (20 mg total) by mouth 2 (two) times daily.  60 tablet  11    Past Medical History  Diagnosis Date  . HYPERLIPIDEMIA   . HYPERTENSION   . CAD   . Aortic valve disorders   . Atrial fibrillation   . CHF   . Edema     Past Surgical History  Procedure Date  . Carpal tunnel release   . Pneumonectomy   . Prostatectomy     ROS: PHYSICAL EXAM BP 170/85  Pulse 78  Ht 5\' 10"  (1.778 m)  Wt 205 lb (92.987 kg)  BMI 29.41 kg/m2 GENERAL:  Well appearing HEENT:  Pupils equal round and reactive, fundi not visualized, oral mucosa unremarkable, edentulous NECK:  No jugular venous distention, waveform within normal limits, carotid upstroke brisk and symmetric, no bruits, no thyromegaly,Transmitted systolic murmur LYMPHATICS:  No cervical, inguinal adenopathy LUNGS:  Clear to auscultation  bilaterally BACK:  No CVA tenderness CHEST:  Unremarkable HEART:  PMI not displaced or sustained,S1 and S2 within normal limits, no S3, no S4, no clicks, no rubs, Blood 6 apical systolic murmur radiating to the aortic outflow tract and early peaking, no diastolic murmur radiating from the chart and the ABD:  Flat, positive bowel sounds normal in frequency in pitch, no bruits, no rebound, no guarding, no midline pulsatile mass, no hepatomegaly, no splenomegaly EXT:  2 plus pulses throughout, trace edema, no cyanosis no clubbing SKIN:  No rashes no nodules, Bruising NEURO:  Cranial nerves II through XII grossly intact, motor grossly intact throughout PSYCH:  Cognitively intact, oriented to person place and time  EKG:  Sinus rhythm, rate 70, left axis deviation, no acute ST-T wave changes  ASSESSMENT AND PLAN

## 2010-08-31 NOTE — Assessment & Plan Note (Signed)
Per primary MD.  I reviewed the lipid results

## 2010-08-31 NOTE — Assessment & Plan Note (Signed)
I would not suspect clinically that this is worse. He had an echo last year. No further imaging is indicated this year.

## 2010-08-31 NOTE — Patient Instructions (Addendum)
Follow up in 1 year in [place] with Dr Antoine Poche Increase Enalapril 20 mg to twice a day

## 2010-08-31 NOTE — Assessment & Plan Note (Signed)
He has no symptoms consistent with unstable angina and he will continue with risk reduction.

## 2010-09-07 ENCOUNTER — Encounter: Payer: Self-pay | Admitting: Cardiology

## 2010-09-13 ENCOUNTER — Encounter: Payer: Self-pay | Admitting: Cardiology

## 2010-09-30 NOTE — Consult Note (Signed)
NAME:  Craig Dean, Craig Dean NO.:  [id number]   MEDICAL RECORD NO.:  [id number]          PATIENT TYPE:  INP   LOCATION:  0163                         FACILITY:  [facility]   PHYSICIAN:  Jonna L. Robb Matar, M.D.DATE OF BIRTH:  1931/07/06   DATE OF CONSULTATION:  DATE OF DISCHARGE:                                   CONSULTATION   REQUESTING PHYSICIAN:  Excell Seltzer. Annabell Howells, M.D.   REASON FOR CONSULTATION:  Atrial fibrillation.   HISTORY:  This is a 77 year old white male who was admitted to the hospital  on July 20 for surgery related to prostatitis, BPH and stones.  The patient  came in with regular sinus rhythm, but today went into rapid atrial  fibrillation at 144 and is now at present still in atrial fibrillation.  The  rate is now down to 110-115.  Of interest, the telemetry on July 21 showed a  heart rate of 38 and 2 second pauses.  The patient apparently was not aware  of this.  Again, on July 21, he became dizzy and diaphoretic.  Blood  pressure 116/70.  Again, the patient had a 3.4 second pause.  Upon  questioning, the patient notes that he has been having episodes similar to  those two for the last month intermittently at home, although he has not  blacked out completely, he has not had chest pain, he has not had worsening  shortness of breath.   PAST MEDICAL HISTORY:  Pertinent cardiology was a heart attack in 1984.  He  survived it well.  Did not have stenting or bypass at that time.  A month  later, underwent a stress test in which he did fine.  As far as he is aware,  he has had no other heart attacks.  In the 1990s, he developed the atrial  fibrillation, and Dr. Antoine Poche has had him on Cardizem pretty much since  that time, according to the patient.  Other cardiac medications include  Lipitor.  He had been on Zetia and that was taken off, and enalapril.  He  has had right pneumonectomy in 1998 for lung cancer but has not been seen by  cardiology in a while.   PHYSICAL EXAMINATION:  VITAL SIGNS:  Heart rate is irregularly irregular at  113.  Blood pressure 115/70.  Respirations 18.  Temp 98.1.  GENERAL:  A well-developed older white male.  HEENT:  NECK:  He has no jugular venous distention.  No carotid bruits.  LUNGS:  There are absent breath sounds on the right.  The left lung is clear  to A&P without wheezing, rales, or rhonchi.  HEART:  Slightly tachycardic without murmurs, rubs or gallops.  ABDOMEN:  Distended.  He has an NG tube in.  He developed an ileus today.  EXTREMITIES:  No clubbing, cyanosis or edema.  NEUROLOGIC:  Alert and oriented x3.  Neurologic exam is intact.   Laboratory data today shows a potassium of 3.3, BUN 10, creatinine 1.  Hemoglobin stable at 9.3.   KUB showed an ileus.   He did have a set of cardiac enzymes on  admission, which were negative.   Chest x-ray basically showed an old right pneumonectomy.   IMPRESSION:  1.  Atrial fibrillation with rapid ventricular response:  This is probably      secondary to being off Diltiazem because he has been  n.p.o.  2.  Tachy/brady syndrome:  I think due to the history for the past month and      several documented episodes of prolonged pauses, I am going to recommend      that he be evaluated per Dr. Antoine Poche, who has seen him previously.  He      may need a consideration of a pacemaker.  At present, his blood pressure      and heart rate do not warrant restarting the Cardizem immediate;      however, I have some concerns of putting him on a Cardizem drip (which      may continue to be necessary if he continues to be fast, may cause      problems with his pauses).   Thank you for the consultation.  I have taken the liberty of contacting the  [facility] cardiology group and will transfer the patient down to the intensive  care unit.       JLB/MEDQ  D:  12/05/2004  T:  12/05/2004  Job:  [id number]

## 2010-09-30 NOTE — Discharge Summary (Signed)
NAME:  Craig Dean, Craig Dean NO.:  [id number]   MEDICAL RECORD NO.:  [id number]          PATIENT TYPE:  INP   LOCATION:  1415                         FACILITY:  [facility]   PHYSICIAN:  Excell Seltzer. Annabell Howells, M.D.    DATE OF BIRTH:  1931/10/26   DATE OF ADMISSION:  12/01/2004  DATE OF DISCHARGE:  12/14/2004                                 DISCHARGE SUMMARY   HISTORY OF PRESENT ILLNESS:  Briefly, Craig Dean is a 76 year old white male  who has a 1-1/2 cm bladder stone with a 102 cc prostate, who was admitted  for an open prostatectomy and cystolithotomy.   PAST HISTORY:   ALLERGIES:  1.  PENICILLIN.  2.  ZETIA.  3.  ZOCOR.   ADMISSION MEDICATIONS:  Include:  1.  Lipitor.  2.  Tiazac.  3.  Vasotec.  4.  Hydrochlorothiazide.  5.  Aspirin.  6.  Fish oil.   MEDICAL HISTORY:  Pertinent for:  1.  Coronary artery disease.  2.  Prior MI.  3.  Hypercholesterolemia.  4.  Urolithiasis.  5.  History of lung cancer.   SURGICAL HISTORY:  Pertinent for:  1.  Right pneumonectomy in 1988.  2.  Pilonidal cystectomy.   REVIEW OF SYSTEMS:  He had had some issues with fever and chills  intermittently over the last several weeks, as well as voiding difficulty.  For additional details of the history and physical please see the attached  office note.   HOSPITAL COURSE:  On the day of admission the patient was taken to the  operating room, where he underwent a suprapubic prostatectomy with cysto  litholapaxy.   Dictation ended at this point.      Excell Seltzer. Annabell Howells, M.D.  Electronically Signed     JJW/MEDQ  D:  12/26/2004  T:  12/26/2004  Job:  [id number]

## 2010-09-30 NOTE — Consult Note (Signed)
NAME:  Craig Dean, Craig Dean NO.:  [id number]   MEDICAL RECORD NO.:  [id number]          PATIENT TYPE:  INP   LOCATION:  4540                         FACILITY:  [facility]   PHYSICIAN:  Rollene Rotunda, M.D.   DATE OF BIRTH:  [birth year]   DATE OF CONSULTATION:  DATE OF DISCHARGE:                                   CONSULTATION   REFERRING PHYSICIAN:  Dr. Bjorn Pippin.   REASON FOR CONSULTATION:  Evaluate patient with atrial fibrillation and  sinuses pauses.   HISTORY OF PRESENT ILLNESS:  The patient is a pleasant 77 year old gentleman  who I know from previous coronary disease.  He has done well from a  cardiovascular standpoint; however, he has been bothered by fevers and  urinary infection.  He has subsequently been found to have a bladder stone  and to require suprapubic prostatectomy.   The patient reports that prior to this surgery, he had been having  intermittent fevers.  He said he would feel quite lightheaded and have a  rapid heart rate, typically in conjunction with his febrile episodes.  In  fact, he had one episode of syncope approximately 3-4 weeks ago.  He said  that prior to this, he had been feeling fine and had been having no cardiac  complaints.  Prior to his febrile illness, he denied any chest pain, neck  discomfort, arm discomfort.  He had been having no presyncope or syncope.  He has not been having any shortness of breath and denies any PND or  orthopnea.  He would rarely feel his heart racing, but this was not  particularly problematic.  Since surgery, the patient was noted to have a  3.44 second pause.  This was apparently while he was dangling his feet over  the bed.  He felt lightheaded and diaphoretic.  He did not lose  consciousness.  He has had other two-second pauses noted prior to this.  All  of this was on July 21, and I have not seen any further pauses since then.  He has atrial fibrillation with a rapid ventricular response.  The course  is  further complicated by an ileus.   PAST MEDICAL HISTORY:  1.  Myocardial infarction with an inferior infarct and scar on Cardiolite in      the past, mildly reduced ejection fraction (50%).  2.  Hyperlipidemia.  3.  Ureterolithiasis.  4.  Squamous cell lung cancer.   PAST SURGICAL HISTORY:  1.  Right pneumonectomy in 1998.  2.  Pilonidal cyst resection.  3.  Prostatectomy.   ALLERGIES:  PENICILLIN.   MEDICATIONS:  1.  Enalapril 20 mg daily.  2.  Lipitor 40 mg daily.  3.  Hydrochlorothiazide 12.5 mg daily.  4.  Diltiazem 240 mg daily.  5.  Aspirin 81 mg daily.   SOCIAL HISTORY:  The patient quit smoking in 1984.  He had a 60-pack-year  history.  He does not drink alcohol.  He is married.   FAMILY HISTORY:  Contributory for early coronary artery disease.   REVIEW OF SYSTEMS:  As stated in the HPI.  Positive  for weight loss,  headaches, dizziness, fatigue, palpitations, mild erectile dysfunction.  Negative for all other systems.   PHYSICAL EXAMINATION:  VITAL SIGNS:  Blood pressure 117/66, heart rate 101  and irregular.  GENERAL:  The patient is in no acute distress.  HEENT:  Eyes unremarkable.  Pupils are equal, round and reactive to light.  Fundi are not visualized.  Oral mucosa unremarkable.  NECK:  No jugular venous distention.  Wave form within normal limits.  Carotid upstrokes are brisk and symmetric.  No bruits or thyromegaly.  LYMPHATICS:  _____.  LUNGS:  Clear to auscultation bilaterally.  BACK:  No costovertebral angle tenderness.  CHEST:  Unremarkable.  HEART:  PMI not displaced or sustained.  S1 and S2 within normal limits.  No  S3.  No murmurs.  ABDOMEN:  Mildly distended.  Absent bowel sounds.  Mildly tender without  rebound or guarding.  No hepatosplenomegaly.  SKIN:  No rashes or nodules.  EXTREMITIES:  Pulses 2+ throughout.  No clubbing, cyanosis or edema.  NEUROLOGIC:  Oriented to person, place, and time.  Cranial nerves II-XII  grossly intact.   Motor grossly intact throughout.   EKG:  On December 05, 2004, atrial fibrillation with a rapid ventricular  response.  Right axis deviation (probably limb lead reversal).  Premature  ventricular contractions.  No acute ST/T wave changes.   ASSESSMENT/PLAN:  1.  Atrial fibrillation:  The patient has new-onset atrial fibrillation,      although in retrospect, he may be having paroxysms of this.  He has a      rapid rate.  Cannot take p.o.  He will be started on IV Diltiazem.  I      have discussed with Dr. Annabell Howells anticoagulation, but we need to hold off      on this, given his problems.  Eventually, he will need Coumadin.  I will      try to get an echocardiogram when his rate is slow to further evaluate      his ejection fraction.  2.  Sinus pauses:  The patient had an episode of this while he was dangling      his feet on July 21.  I have seen no further pauses recently.  I doubt      that he will need a pacemaker but will watch this.  3.  Coronary disease:  The patient has no ongoing symptoms.  We will      continue with secondary to risk reduction.  4.  Mildly reduced ejection fraction:  I would like to see what his EF is      currently.  When he has slowed down a little bit, I will go ahead and      order an echocardiogram.       JH/MEDQ  D:  12/05/2004  T:  12/05/2004  Job:  [id number]   cc:   Ernestina Penna, M.D.  [address] [place]  [place] [address]  Fax: [phone]   Excell Seltzer. Annabell Howells, M.D.  [address]  [place]  [place] [address]  Fax: [phone]

## 2010-09-30 NOTE — Op Note (Signed)
NAME:  Craig Dean, Craig Dean NO.:  [id number]   MEDICAL RECORD NO.:  [id number]          PATIENT TYPE:  INP   LOCATION:  1405                         FACILITY:  [facility]   PHYSICIAN:  Excell Seltzer. Annabell Howells, M.D.    DATE OF BIRTH:  1931/06/08   DATE OF PROCEDURE:  12/01/2004  DATE OF DISCHARGE:                                 OPERATIVE REPORT   PREOPERATIVE DIAGNOSES:  1.  Benign prostatic hypertrophy.  2.  Bladder stones.   POSTOPERATIVE DIAGNOSES:  1.  Benign prostatic hypertrophy.  2.  Bladder stones.   PROCEDURES:  1.  Suprapubic prostatectomy.  2.  Removal of bladder stones.   SURGEON:  Excell Seltzer. Annabell Howells, M.D.   ASSISTANT:  Glade Nurse, M.D.   ANESTHESIA:  General endotracheal.   PROCEDURE:  Patient was identified by his wrist bracelet and brought to room  10, where he received preoperative antibiotics and was administered general  anesthesia.  He was prepped and draped in the usual sterile fashion.  Next,  a 22 French Foley catheter was placed to the level of the urinary bladder.  Clear urine output was obtained, and the balloon was inflated.  Next, a low  midline incision was made from the pubic symphysis to 2 cm inferior to the  umbilicus along the midline.  We dissected down through the dermis,  subcutaneous fascia, Scarpa's fascia with Bovie cautery.  Hemostasis was  maintained throughout this dissection.  Next, the anterior fascial sheath  was identified, and the midline was identified by identification of fibers.  It was divided with Bovie cautery along the length of the incision.  Next,  the pararectus abdominis and pyramidalis were identified and split between  them bluntly.  Next, the transversalis fascia  was opened with sharp  dissection along the line of the incision.  Following this, we identified  the bladder, and it was filled with approximately 200 cc of normal saline  through the Foley catheter.  We then made a vertical incision from the  bladder  neck inferior to the dome with Bovie cautery. Stay sutures were  placed along the middle of the cystotomy using 2-0 Vicryl sutures.  Following this, we identified a bladder stone, which was removed in one  piece with forceps.  Following this, we inspected the bladder.  There were  no mucosal abnormalities or other foreign bodies.  The ureteral orifices  were noted in their normal anatomic position, effluxing clear urine  bilaterally.  We then identified the bladder neck and concomitant BPH.  The  adenoma of the prostate was scored at the bladder neck circumferentially on  the junction between the prostate and the bladder, being careful to stay  away from the ureteral orifices.  Next, starting at the 6 o'clock position,  we developed a plane, which was first developed sharply with Metzenbaum  scissors.  It was then developed circumferentially with the surgeon's first  finger at the level of the apex.  The prostatic adenoma was then removed and  passed off the table for pathologic analysis.  We then packed the prostatic  fossa with warm  sponges, and bleeding points were identified.  We placed  figure-of-eight sutures with 2-0 Vicryl at the 5 and 7 o'clock position of  the bladder neck and several other points in the prostatic fossa.  We also  used Bovie cautery to establish hemostasis.  Excellent hemostasis was  obtained.  We then placed a 22 [place] Ainsworth catheter through the  prostatic urethra and inflated the balloon with approximately 30 cc of  sterile water.  This was brought down snugly into the prostatic fossa and  placed to traction.  Following this, the bladder was closed in two layers  with the deep layer being comprised of 2-0 Vicryl in a running fashion,  taking full-thickness bites, including mucosa.  Once reaching the apex of  the bladder, we placed a suprapubic tube by making an incision in the skin  over 1 cm in length transversely 3-4 cm lateral to the midline incision.   We  passed a Vanderbilt hemostat lateral to the edge of the divided anterior  sheath and a Foley catheter was brought through this tunnel on a path.  It  was placed in the apex of the cystotomy, and the cystotomy was closed to the  apex with a purse-string suture around the suprapubic tube.  We then closed  the bladder in the second layer with another running 2-0 chromic with a  running vertical mattress stitch.  Following this, the penile urethral  catheter was connected to a normal saline continuous bladder irrigation  drip.  Excellent clear urine output was noted from the suprapubic catheter.  A 4-0 Ethilon drain stitch was placed at the level of the skin.  Following  this, we closed the patient's fascia with a #1 PDS in a running fashion.  Following this, we again copiously irrigated the wound, and the skin was  closed with staples.  Dressing was applied.  The patient was then reversed  from his anesthesia, which he tolerated without complications.   Dr. Bjorn Pippin was present and participated in all aspects of this case.       MT/MEDQ  D:  12/01/2004  T:  12/01/2004  Job:  [id number]

## 2010-09-30 NOTE — Discharge Summary (Signed)
NAME:  Craig Dean, Craig Dean NO.:  [id number]   MEDICAL RECORD NO.:  [id number]          PATIENT TYPE:  INP   LOCATION:  1415                         FACILITY:  [facility]   PHYSICIAN:  Excell Seltzer. Annabell Howells, M.D.    DATE OF BIRTH:  1931/12/08   DATE OF ADMISSION:  12/01/2004  DATE OF DISCHARGE:  12/14/2004                                 DISCHARGE SUMMARY   DISCHARGE DIAGNOSES:  1.  Benign prostatic hypertrophy.  2.  Atrial fibrillation.  3.  Postop ileus.  4.  Postop anemia resolved.  5.  Fever of unknown origin.  6.  Coronary artery disease.  7.  Hypercholesterolemia.  8.  Urolithiasis.  9.  History of lung cancer.   PROCEDURE:  Suprapubic prostatectomy.   CONSULTATIONS:  1.  Cardiology.  2.  Infectious disease.  3.  Critical care medicine.   HISTORY OF PRESENT ILLNESS:  This is a 77 year old gentleman who was seen in  consultation for severe voiding difficulty. The patient was evaluated and  found to have obstructive voiding pattern with markedly enlarged prostate.  After reviewing his options, he elected to proceed with open surgical  management for his BPH.   HOSPITAL COURSE:  On December 01, 2004, the patient was taken to the operating  room and underwent the above named procedure which he tolerated without  complication and was taken to the PACU and then to the floor in stable  condition. The patient's hospital course was complicated and prolonged by  several issues which I will summarize now.   #1.  POSTOP UROLOGIC MANAGEMENT.  The patient's lower urinary tract was  managed with a Foley catheter through his penile urethra to the level of  this bladder as well as a suprapubic tube. He was maintained with normal  saline continuous bladder irrigation which was weaned throughout his first  several postoperative days. He had moderate postoperative hematuria for  several days. He was treated with the aforementioned continuous bladder  irrigation as well as transfusion  of packed red blood cells. Eventually his  Foley catheter was removed, suprapubic tube was _________, he was found to  pass a voiding trial without difficulty and his suprapubic tube was then  subsequently removed. He was then noted to have a large output of urine per  his suprapubic tube site due to this Foley catheter was reinserted without  difficulty to allow his suprapubic tract to heal. After several days of  Foley catheter, it was again removed, he was given a voiding trial which he  passed without difficulty.  #2.  POSTOPERATIVE ILEUS.  Postoperatively, the patient experienced  abdominal distention with decreased bowel sounds, absence of flatus. He was  treated with bowel rest, his PCA and anticholinergic medicines were held and  his ileus resolved spontaneously. He then tolerated advancement of diet to a  regular diet.  #3.  ATRIAL FIBRILLATION.  Postoperative day one, the patient was noted to  be in a regular rhythm with rapid ventricular response. Cardiology was  consulted. The patient was also noted to have tachybrady syndrome. He was  managed by cardiology throughout  his hospitalization with Cardizem drip. His  resolved to normal rhythm spontaneously. He was placed back on his  preoperative diltiazem as well as being discharged home on an Ecotrin coated  aspirin.  #4.  FEVER OF UNKNOWN ORIGIN.  Postoperatively, the patient was noted to  have fevers over 101.1 on a daily basis. He was worked up and was found to  have negative urine blood cultures as well as normal transaxial imaging of  his chest, abdomen and pelvis. This was done due to his previous lung cancer  history to rule out any neoplastic causes. Infectious disease was consulted  and the patient was treated with empiric course of vanc and gent which were  discontinued once no infectious etiology could be resolved. The patient has  refused a bone marrow biopsy and he will be scheduled to followup with Dr.  Ninetta Lights of  infectious disease for further evaluation.   DISCHARGE INSTRUCTIONS:  The patient was instructed to call or return if he  begins to experience any fevers, chills or drainage from his wound. He was  instructed to call if he experiences any urinary retention or blood clots  per his urethra. He is instructed not to lift more than 10 pounds for the  next 6 weeks, driving while on narcotic pain medications or while having  abdominal soreness. He was instructed to followup with Dr. Annabell Howells, scheduled  for a wound check. He is scheduled to followup with Dr. Ninetta Lights of  infectious disease for further evaluation of his fever of unknown origin. He  is to followup with his primary care doctor, Ernestina Penna, M.D., for  continued evaluation of his atrial fibrillation which is controlled at time  of discharge on diltiazem and being managed as well with oral low dose  aspirin.   DISCHARGE MEDICATIONS:  1.  Vicodin.  2.  Ditropan.  3.  Colace.  4.  ASA 325 p.o. daily.  5.  Resume previous medications.     ______________________________  Glade Nurse, MD      Excell Seltzer. Annabell Howells, M.D.  Electronically Signed    MT/MEDQ  D:  01/23/2005  T:  01/24/2005  Job:  [id number]

## 2010-09-30 NOTE — H&P (Signed)
NAME:  Craig Dean, Craig Dean NO.:  [id number]   MEDICAL RECORD NO.:  [id number]          PATIENT TYPE:  EMS   LOCATION:  ED                           FACILITY:  [facility]   PHYSICIAN:  Nelma Rothman, MD   DATE OF BIRTH:  1931/07/15   DATE OF ADMISSION:  09/04/2005  DATE OF DISCHARGE:                                HISTORY & PHYSICAL   PRIMARY CARE PHYSICIAN:  Dr. Rudi Heap.   CHIEF COMPLAINT:  Vomiting and fever.   HISTORY OF PRESENT ILLNESS:  The patient is a 77 year old male complaining  of 1 week of vomiting.  He states he cannot keep anything down.  He has also  had a fever at home, which he checked with an axillary thermometer and was  reported to be 102.2.  He had 1 episode of loose stool when all of this  started, but none since.  He actually had a normal bowel movement the day  prior to admission.  He describes minimal if any abdominal pain.  He does  not take any pain medications or nonsteroidal anti-inflammatory medications  for his arthritis.  He only takes a baby aspirin daily in addition to his  blood pressure and cholesterol medications.  He has had no sick contacts.  In fact, he lives with his wife and mother-in-law, who stays with them, and  they have not been sick.  In the emergency department, he has received 1 L  of normal saline as well as Zofran.   PAST MEDICAL HISTORY:  1.  Coronary artery disease.  2.  Hyperlipidemia.  3.  Lung cancer, status post right pneumonectomy in 1998, thought to be      cured.  4.  BPH, status post prostatectomy.  5.  Hypertension.  6.  Arthritis.  7.  Atrial fibrillation, not on anticoagulation for unclear reasons.   ALLERGIES:  PENICILLIN.   MEDICATIONS:  1.  Hydrochlorothiazide 12.5 mg p.o. daily.  2  Enalapril 20 mg p.o. q.p.m.  1.  Lipitor 80 mg p.o. q.p.m.  2.  Aspirin 81 mg p.o. daily.  3.  Taztia XT 240 mg p.o. daily.   SOCIAL HISTORY:  He lives with wife and his mother-in-law stays with them.  He  quit smoking in 1984.  He is a retired Games developer.   FAMILY HISTORY:  Noncontributory, although his mother died in her sleep at  age 27 and his father died at age 87 with end-stage Alzheimer's disease.   REVIEW OF SYSTEMS:  He is otherwise feeling well prior to all of this.  He  denies any chest pain or shortness of breath.  He is feeling somewhat  lightheaded.   PHYSICAL EXAM:  VITAL SIGNS:  Initially, temperature 98.6, pulse 111, blood  pressure 119/74, respiration rate 28, oxygen saturation 97% on room air.  His pulse is now 79 and blood pressure 102/59 after 1 L of fluids.  GENERAL:  He is somewhat uncomfortable secondary to nausea.  HEENT:  His mucous membranes are dry.  NECK:  There is no jugular venous distension.  His veins are flat.  HEART:  Irregularly irregular.  LUNGS:  Clear to auscultation bilaterally.  ABDOMEN:  Soft, nontender and nondistended with normoactive bowel sounds.  EXTREMITIES:  There is no edema.   LABORATORY DATA:  White blood cell count 5.8, hemoglobin 13.5, hematocrit  39.1 and platelet count 183,000.  INR is 1.3, PTT 35.  Sodium 125, potassium  3.6, chloride 91, bicarb 22, BUN 33 and creatinine 1.7.  Glucose is 173.  Total bilirubin 1.8, indirect bilirubin 1.3, direct bilirubin 0.5, alkaline  phosphatase 60, AST 43, ALT 29 and albumin 3.6.  Urinalysis is remarkable  only for ketones.  Amylase is 26 and lipase is 22.   RADIOLOGIC FINDINGS:  Acute abdominal series demonstrated a nonobstructive  bowel gas pattern.   ASSESSMENT/PLAN:  1.  Vomiting.  I am unsure of the etiology of his vomiting.  The duration is      a little long for viral gastroenteritis.  His liver function tests and      lipase are okay.  This may possibly represent an element of peptic ulcer      disease or gastritis.  It seems that a CT would be low yield, given he      is not experiencing any abdominal pain or diarrhea.  However, if the      vomiting persists and  his creatinine improves with intravenous fluids,      this might be the next step.  I would also consider a Gastroenterology      consult and possible upper endoscopy.  The patient has gone to [facility] for his colonoscopies.  We will continue with      antiemetics as needed as well as intravenous fluids.  I will check an      EKG and 1 set of cardiac enzymes to ensure that this is not an atypical      presentation of coronary artery disease.  2.  Acute renal failure likely secondary to dehydration.  We will continue      intravenous fluids as well as hold all diuretics and his ACE inhibitor.  3.  Atrial fibrillation.  We will continue diltiazem for rate control.  We      will monitor on Telemetry while he is here.  I am not sure why he is not      on lifelong anticoagulation, but he is followed by Dr. Antoine Poche of      [facility].  4.  Hyponatremia is most certainly secondary to hypovolemia.  We will      hydrate and repeat to basic metabolic profile.  5.  Mild hyperglycemia.  He has no known history of diabetes and yet his      blood sugar on presentation was 173.  We will check a hemoglobin A1c and      watch his fasting glucoses with morning laboratories.      Nelma Rothman, MD  Electronically Signed     RAR/MEDQ  D:  09/04/2005  T:  09/04/2005  Job:  [id number]

## 2010-09-30 NOTE — Discharge Summary (Signed)
NAME:  Craig Dean, Craig Dean NO.:  [id number]   MEDICAL RECORD NO.:  [id number]          PATIENT TYPE:  INP   LOCATION:  1401                         FACILITY:  [facility]   PHYSICIAN:  Craig Dean, M.D.    DATE OF BIRTH:  09/13/1931   DATE OF ADMISSION:  09/03/2005  DATE OF DISCHARGE:  09/06/2005                                 DISCHARGE SUMMARY   DISCHARGE DIAGNOSES:  1.  Intractable vomiting.  2.  Fever.  3.  Cholelithiasis, per ultrasound of the abdomen on April24 2007.  4.  Acute renal failure secondary to prerenal azotemia.  5.  Hyponatremia.  6.  Hypertension, with low normal blood pressures during the  hospital      course.   DISCHARGE MEDICATIONS:  1.  Protonix 40 mg daily.  2.  Phenergan 12.5 mg every 4-6 hours as needed for nausea.  3.  Aspirin 81 mg daily.  4.  Pazita XT 180 mg daily.  5.  Enalapril, Do Not Take until you are reevaluated by your primary care      physician in the office next week.  6.  Hydrochlorothiazide, Do Not Take until you are reevaluated by your      physician next week.  7.  Lipitor 80 mg daily 1 tablet q.h.s.   DISCHARGE DISPOSITION:  The patient was discharged to home in improved and  stable condition on April25,2007.  He was advised to follow up with Dr.  Christell Dean  in 5-7 days.   PROCEDURES PERFORMED:  Ultrasound of the abdomen on April24,2007.  The  results revealed cholelithiasis, a mildly thickened gallbladder wall, mild  splenomegaly.  No fluid surrounding the gallbladder.  No tenderness to  transducer palpation of the gallbladder.   HISTORY OF PRESENT ILLNESS:  The is a 77 year old man with a past medical  history significant for coronary artery disease, lung cancer and chronic  atrial fibrillation, who presented to the emergency department on  April23,2007 with a chief complaint of vomiting.  The patient complained of  a 1-week history of vomiting and one episode of a loose stool, and one  episode of fever.  He  described very minimal abdominal pain.  Given his  clinical presentation, he was admitted for further evaluation and  management.   HOSPITAL COURSE:  PROBLEM #1 - INTRACTABLE VOMITING/FEVER.  On admission,  the patient was afebrile and hemodynamically stable.  His white blood cell  count was within normal limits at 5.8.  His amylase and lipase were  unremarkable.  His AST was marginally elevated at 43 and his ALT was within  normal limits at 29.  For further evaluation, an acute abdominal series and  urinalysis were ordered.  The acute abdominal series revealed a normal bowel  gas pattern, no free intraperitoneal air, and status post right  pneumonectomy.  There were no acute abnormality seen.  The urinalysis was  essentially negative.  Blood cultures were also ordered and remained  negative during the hospital course.  Cardiac enzymes were ordered to rule  out cardiac-induced nausea and vomiting.  The cardiac enzymes were negative  x1  set.  His EKG on admission revealed sinus rhythm, with an occasional  premature ventricular complex and a heart rate of 79 beats per minute.   The patient was started on symptomatic treatment with as needed Phenergan,  Zofran and Mylanta.  Protonix was started at 40 mg IV q.12 h..  During the  first 24-36 hours of hospitalization, the patient's symptoms completely  resolved.  His diet was advanced and he tolerated it well.  However, he did  note a history of gallstones.  For this reason, an ultrasound of the abdomen  was ordered to rule out acute cholecystitis.  The ultrasound of the abdomen  was performed on April24,2007 and revealed cholelithiasis, a mildly  thickened gallbladder wall, and no other acute inflammatory changes.  Given  these findings, the patient was presented with an option of supportive  management versus a surgery consultation.  Given that the patient's symptoms  had completely resolved and he was totally afebrile, the patient desired  to  be discharged to home without any further intervention.  At the time of  hospital discharge, he was asymptomatic.  His fever had completely resolved.  The patient was advised to avoid greasy and fried foods and to return to the  hospital if his symptoms were to return.   PROBLEM #2 -  ACUTE RENAL INSUFFICIENCY. On admission, the patient's BUN was  18 and his creatinine was 1.2.  Following volume repletion, his BUN improved  to 11 and his creatinine improved to 0.9.   PROBLEM #3  -  MILD HYPONATREMIA.  The patient's serum sodium was 131 on  admission.  He was started on IV fluids for volume repletion.  At the time  of hospital discharge, his serum sodium improved to 135.   PROBLEM #4  -  HYPERTENSION WITH LOW NORMAL BLOOD PRESSURES DURING HOSPITAL  COURSE.  The patient was maintained on Tazita XT 180 mg daily, in part for  treatment of chronic atrial fibrillation.  However, the hydrochlorothiazide  and the enalapril were withheld during the hospital course.  The patient was  volume repleted.  His systolic blood pressures generally ranged into the low  100s during the hospital course.  For this reason, the patient was advised  to not take the enalapril and hydrochlorothiazide until he is reevaluated by  Dr. Christell Dean in the office next week.      Craig Dean, M.D.  Electronically Signed     DF/MEDQ  D:  09/11/2005  T:  09/11/2005  Job:  [id number]   cc:   Craig Dean, M.D.  Fax: [phone]

## 2011-01-12 ENCOUNTER — Encounter: Payer: Self-pay | Admitting: Cardiology

## 2011-01-12 NOTE — Progress Notes (Signed)
Quick Note:  RN Preliminarily reviewed. Forwarded to MD desktop for review and signature ______ 

## 2011-07-28 ENCOUNTER — Other Ambulatory Visit: Payer: Self-pay

## 2011-07-28 ENCOUNTER — Other Ambulatory Visit: Payer: Self-pay | Admitting: Cardiology

## 2011-07-28 MED ORDER — DILTIAZEM HCL ER BEADS 300 MG PO CP24: 300.0000 mg | ORAL_CAPSULE | Freq: Every day | ORAL | Status: DC

## 2011-07-28 NOTE — Telephone Encounter (Signed)
Refill- Diltiazem (TIAZAC) 300 MG 24 hr capsule  [facility] [phone]  Verified patient and pharmacy info

## 2011-08-03 ENCOUNTER — Encounter: Payer: Self-pay | Admitting: Cardiology

## 2011-09-06 ENCOUNTER — Ambulatory Visit (INDEPENDENT_AMBULATORY_CARE_PROVIDER_SITE_OTHER): Payer: Medicare Other | Admitting: Cardiology

## 2011-09-06 ENCOUNTER — Encounter: Payer: Self-pay | Admitting: Cardiology

## 2011-09-06 VITALS — BP 146/85 | HR 74 | Ht 70.0 in | Wt 205.0 lb

## 2011-09-06 DIAGNOSIS — I509 Heart failure, unspecified: Secondary | ICD-10-CM

## 2011-09-06 DIAGNOSIS — I10 Essential (primary) hypertension: Secondary | ICD-10-CM

## 2011-09-06 DIAGNOSIS — I359 Nonrheumatic aortic valve disorder, unspecified: Secondary | ICD-10-CM

## 2011-09-06 DIAGNOSIS — I2510 Atherosclerotic heart disease of native coronary artery without angina pectoris: Secondary | ICD-10-CM

## 2011-09-06 MED ORDER — HYDROCHLOROTHIAZIDE 12.5 MG PO TABS: 12.5000 mg | ORAL_TABLET | Freq: Every day | ORAL | Status: DC

## 2011-09-06 NOTE — Patient Instructions (Signed)
Please start Hydrochlorothiazide 12.5 mg once a day Continue all other medications as listed  Your physician has requested that you have a lexiscan myoview. For further information please visit [email or web address] Please follow instruction sheet, as given.  Follow up in 6 months with Dr Antoine Poche.  You will receive a letter in the mail 2 months before you are due.  Please call us when you receive this letter to schedule your follow up appointment.

## 2011-09-06 NOTE — Assessment & Plan Note (Signed)
He has had moderate stenosis in the past. I will see him back in 6 months and likely order an echo to followup on this.

## 2011-09-06 NOTE — Progress Notes (Signed)
   HPI Patient presents for followup of mild cardiomyopathy aortic stenosis.  Since I last saw him he does not report new cardiovascular symptoms. He does do some activities and notices it if he overdoes it he might get a little chest discomfort. Again however he's not sure whether this is new. He has some dyspnea since her pneumonectomy years ago but no PND or orthopnea. He denies any neck or arm discomfort. He doesn't notice any palpitations, presyncope or syncope.  Allergies  Allergen Reactions  . Penicillins     Current Outpatient Prescriptions  Medication Sig Dispense Refill  . acetaminophen (TYLENOL ARTHRITIS PAIN) 650 MG CR tablet Take 650 mg by mouth every 8 (eight) hours as needed.        Marland Kitchen aspirin 81 MG tablet Take 81 mg by mouth daily.        . cholecalciferol (VITAMIN D) 1000 UNITS tablet Take 1,000 Units by mouth 2 (two) times daily.        Marland Kitchen diltiazem (TIAZAC) 300 MG 24 hr capsule Take 1 capsule (300 mg total) by mouth daily.  30 capsule  1  . enalapril (VASOTEC) 20 MG tablet Take 1 tablet (20 mg total) by mouth 2 (two) times daily.  60 tablet  11    Past Medical History  Diagnosis Date  . HYPERLIPIDEMIA   . HYPERTENSION   . CAD   . Aortic valve disorders   . Atrial fibrillation   . CHF   . Edema     Past Surgical History  Procedure Date  . Carpal tunnel release   . Pneumonectomy   . Prostatectomy     ROS:  As stated in the HPI and negative for all other systems.  PHYSICAL EXAM BP 146/85  Pulse 74  Ht 5\' 10"  (1.778 m)  Wt 205 lb (92.987 kg)  BMI 29.41 kg/m2 GENERAL:  Well appearing HEENT:  Pupils equal round and reactive, fundi not visualized, oral mucosa unremarkable, edentulous NECK:  No jugular venous distention, waveform within normal limits, carotid upstroke brisk and symmetric, no bruits, no thyromegaly,transmitted systolic murmur LYMPHATICS:  No cervical, inguinal adenopathy LUNGS:  Decreased breath sounds right side BACK:  No CVA  tenderness CHEST:  Unremarkable HEART:  PMI not displaced or sustained,S1 and S2 within normal limits, no S3, no S4, no clicks, no rubs, 3/6apical systolic murmur radiating to the aortic outflow tract and early peaking, no diastolic murmur radiating from the chart and the ABD:  Flat, positive bowel sounds normal in frequency in pitch, no bruits, no rebound, no guarding, no midline pulsatile mass, no hepatomegaly, no splenomegaly EXT:  2 plus pulses throughout, trace edema, no cyanosis no clubbing SKIN:  No rashes no nodules, Bruising NEURO:  Cranial nerves II through XII grossly intact, motor grossly intact throughout PSYCH:  Cognitively intact, oriented to person place and time  EKG:  Sinus rhythm, rate 74, left axis deviation, T-wave inversion in inferior leads III and aVF new since previous.  09/06/2011  ASSESSMENT AND PLAN

## 2011-09-06 NOTE — Assessment & Plan Note (Signed)
His blood pressure is not quite at target. I'm going HCTZ 12.5 mg daily to his existing regimen.

## 2011-09-06 NOTE — Assessment & Plan Note (Signed)
He has had a mildly reduced ejection fraction but he seems to be euvolemic at this point.

## 2011-09-06 NOTE — Assessment & Plan Note (Signed)
He has had a distant inferior infarct. He does have some discomfort with exertion and new T-wave changes on his EKG. I will screen him with an exercise perfusion study.

## 2011-09-14 ENCOUNTER — Ambulatory Visit (HOSPITAL_COMMUNITY): Payer: Medicare Other | Attending: Cardiology | Admitting: Radiology

## 2011-09-14 VITALS — BP 130/69 | Ht 70.0 in | Wt 199.0 lb

## 2011-09-14 DIAGNOSIS — E785 Hyperlipidemia, unspecified: Secondary | ICD-10-CM | POA: Insufficient documentation

## 2011-09-14 DIAGNOSIS — R079 Chest pain, unspecified: Secondary | ICD-10-CM

## 2011-09-14 DIAGNOSIS — Z87891 Personal history of nicotine dependence: Secondary | ICD-10-CM | POA: Insufficient documentation

## 2011-09-14 DIAGNOSIS — I10 Essential (primary) hypertension: Secondary | ICD-10-CM | POA: Insufficient documentation

## 2011-09-14 DIAGNOSIS — R0609 Other forms of dyspnea: Secondary | ICD-10-CM | POA: Insufficient documentation

## 2011-09-14 DIAGNOSIS — R5383 Other fatigue: Secondary | ICD-10-CM | POA: Insufficient documentation

## 2011-09-14 DIAGNOSIS — R0602 Shortness of breath: Secondary | ICD-10-CM

## 2011-09-14 DIAGNOSIS — R9431 Abnormal electrocardiogram [ECG] [EKG]: Secondary | ICD-10-CM | POA: Insufficient documentation

## 2011-09-14 DIAGNOSIS — R0989 Other specified symptoms and signs involving the circulatory and respiratory systems: Secondary | ICD-10-CM | POA: Insufficient documentation

## 2011-09-14 DIAGNOSIS — I2510 Atherosclerotic heart disease of native coronary artery without angina pectoris: Secondary | ICD-10-CM

## 2011-09-14 DIAGNOSIS — R0789 Other chest pain: Secondary | ICD-10-CM | POA: Insufficient documentation

## 2011-09-14 DIAGNOSIS — R5381 Other malaise: Secondary | ICD-10-CM | POA: Insufficient documentation

## 2011-09-14 MED ORDER — REGADENOSON 0.4 MG/5ML IV SOLN
0.4000 mg | Freq: Once | INTRAVENOUS | Status: AC
  Administered 2011-09-14: 0.4 mg via INTRAVENOUS

## 2011-09-14 MED ORDER — TECHNETIUM TC 99M TETROFOSMIN IV KIT
10.0000 | PACK | Freq: Once | INTRAVENOUS | Status: AC | PRN
  Administered 2011-09-14: 10 via INTRAVENOUS

## 2011-09-14 MED ORDER — TECHNETIUM TC 99M TETROFOSMIN IV KIT
30.0000 | PACK | Freq: Once | INTRAVENOUS | Status: AC | PRN
  Administered 2011-09-14: 30 via INTRAVENOUS

## 2011-09-14 NOTE — Progress Notes (Signed)
[facility] SITE 3 NUCLEAR MED [address] [place] [address] [phone]  Cardiology Nuclear Med Study  Craig Dean is a 78 y.o. male     MRN : [id number]     DOB: 1932/05/03  Procedure Date: 09/14/2011  Nuclear Med Background Indication for Stress Test:  Evaluation for Ischemia and Abnormal EKG History:  '84 IWMI (patient states he had a cath by Dr. Clarene Duke that showed 90% in one artery, medical tx-no report found); '02 MPS:No ischemia, inferior thinning,  EF=40%; '11 Echo:EF=45%, mild-moderate AS. Cardiac Risk Factors: History of Smoking, Hypertension and Lipids  Symptoms:  Chest Tightness with and without Exertion (last episode of chest discomfort:now, 3/10, he says it is constant), DOE and Fatigue   Nuclear Pre-Procedure Caffeine/Decaff Intake:  None> 12 hrs NPO After: 5:00am   Lungs:  Clear. O2 Sat: 98% on room air. IV 0.9% NS with Angio Cath:  22g  IV Site: R Wrist , tolerated well IV Started by:  Irean Hong, RN  Chest Size (in):  44 Cup Size: n/a  Height: 5\' 10"  (1.778 m)  Weight:  199 lb (90.266 kg)  BMI:  Body mass index is 28.55 kg/(m^2). Tech Comments:  n/a    Nuclear Med Study 1 or 2 day study: 1 day  Stress Test Type:  Lexiscan  Reading MD: Cassell Clement, MD  Order Authorizing Provider:  Rollene Rotunda, MD  Resting Radionuclide: Technetium 62m Tetrofosmin  Resting Radionuclide Dose: 11.0 mCi   Stress Radionuclide:  Technetium 46m Tetrofosmin  Stress Radionuclide Dose: 33.0 mCi           Stress Protocol Rest HR: 64 Stress HR: 75  Rest BP: 130/69 Stress BP: 128/72  Exercise Time (min): n/a METS: n/a   Predicted Max HR: 143 bpm % Max HR: 52.45 bpm Rate Pressure Product: 9600   Dose of Adenosine (mg):  n/a Dose of Lexiscan: 0.4 mg  Dose of Atropine (mg): n/a Dose of Dobutamine: n/a mcg/kg/min (at max HR)  Stress Test Technologist: Smiley Houseman, CMA-N  Nuclear Technologist:  Doyne Keel, CNMT     Rest Procedure:   Myocardial perfusion imaging was performed at rest 45 minutes following the intravenous administration of Technetium 70m Tetrofosmin.  Rest ECG: Nonspecific T-wave changes with occasional PVC.  Stress Procedure:  The patient received IV Lexiscan 0.4 mg over 15-seconds.  Technetium 85m Tetrofosmin injected at 30-seconds.  There was a hypotensive response to the Lexiscan bolus and occasional PVC's.  Quantitative spect images were obtained after a 45 minute delay.  Stress ECG: No significant change from baseline ECG  QPS Raw Data Images:  Normal; no motion artifact; normal heart/lung ratio. Stress Images:  There is decreased uptake in the inferior wall. Rest Images:  There is decreased uptake in the inferior wall. Subtraction (SDS):  There is a fixed defect that is most consistent with a previous infarction with partial reversibility. Transient Ischemic Dilatation (Normal <1.22):  1.03 Lung/Heart Ratio (Normal <0.45):  0.27  Quantitative Gated Spect Images QGS EDV:  148 ml QGS ESV:  89 ml  Impression Exercise Capacity:  Lexiscan with no exercise. BP Response:  Hypotensive blood pressure response. Clinical Symptoms:  Mild chest pain/dyspnea. Dizziness with hypotension post infusion of Lexiscan. ECG Impression:  No significant ST segment change suggestive of ischemia. Comparison with Prior Nuclear Study: Since 07/05/00 there is now a medium size inferior wall scar involving the basal and mid-inferior wall with partial reversibility.  Overall Impression:  Abnormal stress nuclear study.  LV Ejection Fraction: 40%.  LV Wall Motion:  There is hypokinesis of the mid and distal inferior wall.  Cassell Clement

## 2011-09-20 ENCOUNTER — Ambulatory Visit (INDEPENDENT_AMBULATORY_CARE_PROVIDER_SITE_OTHER): Payer: Medicare Other | Admitting: Cardiology

## 2011-09-20 ENCOUNTER — Encounter: Payer: Self-pay | Admitting: Cardiology

## 2011-09-20 ENCOUNTER — Encounter: Payer: Self-pay | Admitting: *Deleted

## 2011-09-20 VITALS — BP 135/70 | HR 80 | Ht 70.0 in | Wt 200.0 lb

## 2011-09-20 DIAGNOSIS — I10 Essential (primary) hypertension: Secondary | ICD-10-CM

## 2011-09-20 DIAGNOSIS — I2510 Atherosclerotic heart disease of native coronary artery without angina pectoris: Secondary | ICD-10-CM

## 2011-09-20 DIAGNOSIS — I359 Nonrheumatic aortic valve disorder, unspecified: Secondary | ICD-10-CM

## 2011-09-20 NOTE — Patient Instructions (Signed)
The current medical regimen is effective;  continue present plan and medications.  Your physician has requested that you have a cardiac catheterization. Cardiac catheterization is used to diagnose and/or treat various heart conditions. Doctors may recommend this procedure for a number of different reasons. The most common reason is to evaluate chest pain. Chest pain can be a symptom of coronary artery disease (CAD), and cardiac catheterization can show whether plaque is narrowing or blocking your heart's arteries. This procedure is also used to evaluate the valves, as well as measure the blood flow and oxygen levels in different parts of your heart. For further information please visit [email or web address] Please follow instruction sheet, as given.  Follow up will be given after cardiac cath.

## 2011-09-20 NOTE — Assessment & Plan Note (Signed)
Given his pain, history of CAD and a stress perfusion study cardiac catheterization is indicated.  The patient understands that risks included but are not limited to stroke (1 in 1000), death (1 in 1000), kidney failure [usually temporary] (1 in 500), bleeding (1 in 200), allergic reaction [possibly serious] (1 in 200).  The patient understands and agrees to proceed.

## 2011-09-20 NOTE — Assessment & Plan Note (Signed)
The blood pressure is at target. No change in medications is indicated. We will continue with therapeutic lifestyle changes (TLC).  

## 2011-09-20 NOTE — Progress Notes (Signed)
   HPI Patient presents for followup of mild cardiomyopathy aortic stenosis.  At the last appt he described a little chest discomfort. He was not sure whether this was new. He has some dyspnea since her pneumonectomy years ago but no PND or orthopnea. He denies any neck or arm discomfort. He doesn't notice any palpitations, presyncope or syncope.   I sent him for a stress perfusion study.  Comparison with a prior nuclear study demonstarted that since 07/05/00 there was now a medium size inferior wall scar involving the basal and mid-inferior wall with partial reversibility.  EF was 40%.  He cam back today to discuss this.  Of note his prior history was a distant infarct but I was not clear on the anatomy.  He has had an EF of 45% recorded before.   Allergies  Allergen Reactions  . Penicillins     Current Outpatient Prescriptions  Medication Sig Dispense Refill  . acetaminophen (TYLENOL ARTHRITIS PAIN) 650 MG CR tablet Take 650 mg by mouth every 8 (eight) hours as needed.        . aspirin 81 MG tablet Take 81 mg by mouth daily.        . cholecalciferol (VITAMIN D) 1000 UNITS tablet Take 1,000 Units by mouth 2 (two) times daily.        . diltiazem (TIAZAC) 300 MG 24 hr capsule Take 1 capsule (300 mg total) by mouth daily.  30 capsule  1  . enalapril (VASOTEC) 20 MG tablet Take 1 tablet (20 mg total) by mouth 2 (two) times daily.  60 tablet  11  . hydrochlorothiazide (HYDRODIURIL) 12.5 MG tablet Take 1 tablet (12.5 mg total) by mouth daily.  30 tablet  11    Past Medical History  Diagnosis Date  . HYPERLIPIDEMIA   . HYPERTENSION   . CAD   . Aortic valve disorders   . Atrial fibrillation   . CHF   . Edema   . Lung cancer   . ITP (idiopathic thrombocytopenic purpura)     Past Surgical History  Procedure Date  . Carpal tunnel release   . Pneumonectomy   . Prostatectomy     ROS:  As stated in the HPI and negative for all other systems.  PHYSICAL EXAM BP 135/70  Pulse 80  Ht 5'  10" (1.778 m)  Wt 200 lb (90.719 kg)  BMI 28.70 kg/m2 GENERAL:  Well appearing HEENT:  Pupils equal round and reactive, fundi not visualized, oral mucosa unremarkable, edentulous NECK:  No jugular venous distention, waveform within normal limits, carotid upstroke brisk and symmetric, no bruits, no thyromegaly,transmitted systolic murmur LYMPHATICS:  No cervical, inguinal adenopathy LUNGS:  Decreased breath sounds right side BACK:  No CVA tenderness CHEST:  Unremarkable HEART:  PMI not displaced or sustained,S1 and S2 within normal limits, no S3, no S4, no clicks, no rubs, 3/6apical systolic murmur radiating to the aortic outflow tract and early peaking, no diastolic murmur radiating from the chart and the ABD:  Flat, positive bowel sounds normal in frequency in pitch, no bruits, no rebound, no guarding, no midline pulsatile mass, no hepatomegaly, no splenomegaly EXT:  2 plus pulses throughout, trace edema, no cyanosis no clubbing SKIN:  No rashes no nodules, bruising NEURO:  Cranial nerves II through XII grossly intact, motor grossly intact throughout PSYCH:  Cognitively intact, oriented to person place and time  ASSESSMENT AND PLAN  

## 2011-09-20 NOTE — Assessment & Plan Note (Signed)
He has had moderate stenosis in the past. I will assess this further at the time of his cath.

## 2011-09-24 ENCOUNTER — Other Ambulatory Visit: Payer: Self-pay | Admitting: Cardiology

## 2011-09-25 NOTE — Telephone Encounter (Signed)
..   Requested Prescriptions   Signed Prescriptions Disp Refills  . enalapril (VASOTEC) 20 MG tablet 60 tablet 11    Sig: TAKE ONE TABLET BY MOUTH TWICE A DAY    Authorizing Provider: Rollene Rotunda    Ordering User: Christella Hartigan, Yancarlos Berthold Judie Petit

## 2011-09-26 ENCOUNTER — Inpatient Hospital Stay (HOSPITAL_BASED_OUTPATIENT_CLINIC_OR_DEPARTMENT_OTHER)
Admission: RE | Admit: 2011-09-26 | Discharge: 2011-09-26 | Disposition: A | Discharge status: Home / Self Care | Payer: Medicare Other | Source: Ambulatory Visit | Attending: Cardiology | Admitting: Cardiology

## 2011-09-26 ENCOUNTER — Encounter (HOSPITAL_BASED_OUTPATIENT_CLINIC_OR_DEPARTMENT_OTHER)
Admission: RE | Disposition: A | Discharge status: Home / Self Care | Payer: Self-pay | Source: Ambulatory Visit | Attending: Cardiology

## 2011-09-26 ENCOUNTER — Other Ambulatory Visit: Payer: Self-pay | Admitting: Cardiology

## 2011-09-26 DIAGNOSIS — R0989 Other specified symptoms and signs involving the circulatory and respiratory systems: Secondary | ICD-10-CM | POA: Insufficient documentation

## 2011-09-26 DIAGNOSIS — R0609 Other forms of dyspnea: Secondary | ICD-10-CM | POA: Insufficient documentation

## 2011-09-26 DIAGNOSIS — R9439 Abnormal result of other cardiovascular function study: Secondary | ICD-10-CM | POA: Insufficient documentation

## 2011-09-26 DIAGNOSIS — I2510 Atherosclerotic heart disease of native coronary artery without angina pectoris: Secondary | ICD-10-CM

## 2011-09-26 DIAGNOSIS — I2582 Chronic total occlusion of coronary artery: Secondary | ICD-10-CM | POA: Insufficient documentation

## 2011-09-26 LAB — POCT I-STAT 3, ART BLOOD GAS (G3+)
O2 Saturation: 96 %
pCO2 arterial: 45.2 mmHg — ABNORMAL HIGH (ref 35.0–45.0)
pH, Arterial: 7.42 (ref 7.350–7.450)

## 2011-09-26 LAB — POCT I-STAT 3, VENOUS BLOOD GAS (G3P V)
Acid-Base Excess: 2 mmol/L (ref 0.0–2.0)
O2 Saturation: 64 %

## 2011-09-26 SURGERY — JV LEFT HEART CATHETERIZATION WITH CORONARY ANGIOGRAM
Anesthesia: Moderate Sedation

## 2011-09-26 MED ORDER — SODIUM CHLORIDE 0.9 % IJ SOLN: 3.0000 mL | Freq: Two times a day (BID) | INTRAMUSCULAR | Status: DC

## 2011-09-26 MED ORDER — ACETAMINOPHEN 325 MG PO TABS: 650.0000 mg | ORAL_TABLET | ORAL | Status: DC | PRN

## 2011-09-26 MED ORDER — SODIUM CHLORIDE 0.9 % IJ SOLN: 3.0000 mL | INTRAMUSCULAR | Status: DC | PRN

## 2011-09-26 MED ORDER — ONDANSETRON HCL 4 MG/2ML IJ SOLN: 4.0000 mg | Freq: Four times a day (QID) | INTRAMUSCULAR | Status: DC | PRN

## 2011-09-26 MED ORDER — SODIUM CHLORIDE 0.9 % IV SOLN: INTRAVENOUS | Status: DC

## 2011-09-26 MED ORDER — SODIUM CHLORIDE 0.9 % IV SOLN: 250.0000 mL | INTRAVENOUS | Status: DC | PRN

## 2011-09-26 MED ORDER — SODIUM CHLORIDE 0.9 % IV SOLN
INTRAVENOUS | Status: DC
  Administered 2011-09-26: 12:00:00 via INTRAVENOUS

## 2011-09-26 MED ORDER — ASPIRIN 81 MG PO CHEW
324.0000 mg | CHEWABLE_TABLET | ORAL | Status: AC
  Administered 2011-09-26: 324 mg via ORAL

## 2011-09-26 NOTE — Progress Notes (Signed)
Bedrest begins @  1350.

## 2011-09-26 NOTE — CV Procedure (Signed)
  Cardiac Catheterization Procedure Note  Name: Craig Dean MRN: [id number] DOB: [birth year]  Procedure: Right Heart Cath, Left Heart Cath, Selective Coronary Angiography, LV angiography  Indication:  Dyspnea, previous history of CAD and an abnormal stress test with inferior infarct with ischemia  Procedural Details: The right groin was prepped, draped, and anesthetized with 1% lidocaine. Using the modified Seldinger technique a 4 French sheath was placed in the right femoral artery and a 7 French sheath was placed in the right femoral vein. For adequate injection I upgraded to a 5 [place] system. A Swan-Ganz catheter was used for the right heart catheterization. Standard protocol was followed for recording of right heart pressures and sampling of oxygen saturations. Fick cardiac output was calculated. Standard Judkins catheters were used for selective coronary angiography and left ventriculography. There were no immediate procedural complications. The patient was transferred to the post catheterization recovery area for further monitoring.  Procedural Findings:  Hemodynamics:               RA 4    RV 34/2    PA 28/6  Mean 16    PCWP 6    LV 135/14    AO 132/12   Oxygen saturations:    PA 96    AO 64   Cardiac Output (Fick) 3.9                               Cardiac Index (Fick) 1.9   Coronary angiography:  Coronary dominance: right  Left mainstem: Normal  Left anterior descending (LAD): Proximal and mid heavy calcification.  Prox long 60% stenosis.  Mid diagonal small with long 40%.  Left circumflex (LCx): RI large with luminal irregularities.  AV groove proximal long 25%.  MOM large branching with luminal irregularities.  Right coronary artery (RCA): Dominant.  100% proximal occlusion with bridging collaterals.  Left ventriculography: Left ventricular systolic function is normal, LVEF is estimated at 55-65%, there is no significant mitral regurgitation   Final  Conclusions:  Severe single vessel CAD.  Preserved EF.  Normal right heart pressures.  Of note I had some difficulty crossing the aortic valve and there is an indication of mild AS on the echo.  The gradient by cath was minimal.  Recommendations:  Medical management.   Rollene Rotunda 09/26/2011, 1:33 PM

## 2011-09-27 NOTE — Interval H&P Note (Signed)
History and Physical Interval Note:  09/27/2011 8:40 PM  Craig Dean  has presented today for surgery, with the diagnosis of positive myoview  The various methods of treatment have been discussed with the patient and family. After consideration of risks, benefits and other options for treatment, the patient has consented to  Procedure(s) (LRB): JV LEFT HEART CATHETERIZATION WITH CORONARY ANGIOGRAM (N/A) as a surgical intervention .  The patients' history has been reviewed, patient examined, no change in status, stable for surgery.  I have reviewed the patients' chart and labs.  Questions were answered to the patient's satisfaction.     Rollene Rotunda

## 2011-09-27 NOTE — H&P (View-Only) (Signed)
   HPI Patient presents for followup of mild cardiomyopathy aortic stenosis.  At the last appt he described a little chest discomfort. He was not sure whether this was new. He has some dyspnea since her pneumonectomy years ago but no PND or orthopnea. He denies any neck or arm discomfort. He doesn't notice any palpitations, presyncope or syncope.   I sent him for a stress perfusion study.  Comparison with a prior nuclear study demonstarted that since 07/05/00 there was now a medium size inferior wall scar involving the basal and mid-inferior wall with partial reversibility.  EF was 40%.  He cam back today to discuss this.  Of note his prior history was a distant infarct but I was not clear on the anatomy.  He has had an EF of 45% recorded before.   Allergies  Allergen Reactions  . Penicillins     Current Outpatient Prescriptions  Medication Sig Dispense Refill  . acetaminophen (TYLENOL ARTHRITIS PAIN) 650 MG CR tablet Take 650 mg by mouth every 8 (eight) hours as needed.        Marland Kitchen aspirin 81 MG tablet Take 81 mg by mouth daily.        . cholecalciferol (VITAMIN D) 1000 UNITS tablet Take 1,000 Units by mouth 2 (two) times daily.        Marland Kitchen diltiazem (TIAZAC) 300 MG 24 hr capsule Take 1 capsule (300 mg total) by mouth daily.  30 capsule  1  . enalapril (VASOTEC) 20 MG tablet Take 1 tablet (20 mg total) by mouth 2 (two) times daily.  60 tablet  11  . hydrochlorothiazide (HYDRODIURIL) 12.5 MG tablet Take 1 tablet (12.5 mg total) by mouth daily.  30 tablet  11    Past Medical History  Diagnosis Date  . HYPERLIPIDEMIA   . HYPERTENSION   . CAD   . Aortic valve disorders   . Atrial fibrillation   . CHF   . Edema   . Lung cancer   . ITP (idiopathic thrombocytopenic purpura)     Past Surgical History  Procedure Date  . Carpal tunnel release   . Pneumonectomy   . Prostatectomy     ROS:  As stated in the HPI and negative for all other systems.  PHYSICAL EXAM BP 135/70  Pulse 80  Ht 5'  10" (1.778 m)  Wt 200 lb (90.719 kg)  BMI 28.70 kg/m2 GENERAL:  Well appearing HEENT:  Pupils equal round and reactive, fundi not visualized, oral mucosa unremarkable, edentulous NECK:  No jugular venous distention, waveform within normal limits, carotid upstroke brisk and symmetric, no bruits, no thyromegaly,transmitted systolic murmur LYMPHATICS:  No cervical, inguinal adenopathy LUNGS:  Decreased breath sounds right side BACK:  No CVA tenderness CHEST:  Unremarkable HEART:  PMI not displaced or sustained,S1 and S2 within normal limits, no S3, no S4, no clicks, no rubs, 3/6apical systolic murmur radiating to the aortic outflow tract and early peaking, no diastolic murmur radiating from the chart and the ABD:  Flat, positive bowel sounds normal in frequency in pitch, no bruits, no rebound, no guarding, no midline pulsatile mass, no hepatomegaly, no splenomegaly EXT:  2 plus pulses throughout, trace edema, no cyanosis no clubbing SKIN:  No rashes no nodules, bruising NEURO:  Cranial nerves II through XII grossly intact, motor grossly intact throughout PSYCH:  Cognitively intact, oriented to person place and time  ASSESSMENT AND PLAN

## 2011-10-11 ENCOUNTER — Encounter: Payer: Medicare Other | Admitting: Cardiology

## 2011-10-21 ENCOUNTER — Other Ambulatory Visit: Payer: Self-pay | Admitting: Cardiology

## 2011-11-28 ENCOUNTER — Encounter: Payer: Self-pay | Admitting: Cardiology

## 2011-12-05 ENCOUNTER — Encounter: Payer: Self-pay | Admitting: Cardiology

## 2011-12-16 ENCOUNTER — Other Ambulatory Visit: Payer: Self-pay | Admitting: Cardiology

## 2011-12-18 NOTE — Telephone Encounter (Signed)
..   Requested Prescriptions   Pending Prescriptions Disp Refills  . diltiazem (TIAZAC) 300 MG 24 hr capsule [Pharmacy Med Name: DILTIAZEM HCL ER 300 MG CAP] 30 capsule 6    Sig: TAKE 1 CAPSULE (300 MG TOTAL) BY MOUTH DAILY.   

## 2012-02-28 ENCOUNTER — Encounter: Payer: Self-pay | Admitting: Cardiology

## 2012-02-29 ENCOUNTER — Encounter: Payer: Self-pay | Admitting: Cardiology

## 2012-03-06 ENCOUNTER — Other Ambulatory Visit: Payer: Self-pay | Admitting: Family Medicine

## 2012-03-06 DIAGNOSIS — R222 Localized swelling, mass and lump, trunk: Secondary | ICD-10-CM

## 2012-03-08 ENCOUNTER — Ambulatory Visit (HOSPITAL_COMMUNITY)
Admission: RE | Admit: 2012-03-08 | Discharge: 2012-03-08 | Disposition: A | Discharge status: Home / Self Care | Payer: Medicare Other | Source: Ambulatory Visit | Attending: Family Medicine | Admitting: Family Medicine

## 2012-03-08 DIAGNOSIS — R222 Localized swelling, mass and lump, trunk: Secondary | ICD-10-CM | POA: Insufficient documentation

## 2012-03-08 DIAGNOSIS — K8020 Calculus of gallbladder without cholecystitis without obstruction: Secondary | ICD-10-CM | POA: Insufficient documentation

## 2012-03-08 DIAGNOSIS — I700 Atherosclerosis of aorta: Secondary | ICD-10-CM | POA: Insufficient documentation

## 2012-03-08 DIAGNOSIS — I10 Essential (primary) hypertension: Secondary | ICD-10-CM | POA: Insufficient documentation

## 2012-03-08 DIAGNOSIS — Z85118 Personal history of other malignant neoplasm of bronchus and lung: Secondary | ICD-10-CM | POA: Insufficient documentation

## 2012-07-10 ENCOUNTER — Other Ambulatory Visit: Payer: Self-pay | Admitting: Cardiology

## 2012-07-10 NOTE — Telephone Encounter (Signed)
..   Requested Prescriptions   Pending Prescriptions Disp Refills  . diltiazem (TIAZAC) 300 MG 24 hr capsule [Pharmacy Med Name: DILTIAZEM HCL ER 300 MG CAP] 30 capsule 6    Sig: TAKE 1 CAPSULE (300 MG TOTAL) BY MOUTH DAILY.

## 2012-07-26 ENCOUNTER — Other Ambulatory Visit: Payer: Self-pay | Admitting: Cardiology

## 2012-07-31 ENCOUNTER — Other Ambulatory Visit (INDEPENDENT_AMBULATORY_CARE_PROVIDER_SITE_OTHER): Payer: Medicare Other

## 2012-07-31 DIAGNOSIS — Z1212 Encounter for screening for malignant neoplasm of rectum: Secondary | ICD-10-CM

## 2012-07-31 NOTE — Progress Notes (Signed)
Patient dropped off fecal occult 

## 2012-08-21 ENCOUNTER — Other Ambulatory Visit: Payer: Self-pay | Admitting: Cardiology

## 2012-09-16 ENCOUNTER — Other Ambulatory Visit: Payer: Self-pay | Admitting: Cardiology

## 2012-09-17 NOTE — Telephone Encounter (Signed)
..   Requested Prescriptions   Pending Prescriptions Disp Refills  . enalapril (VASOTEC) 20 MG tablet [Pharmacy Med Name: ENALAPRIL MALEATE 20 MG TAB] 60 tablet 6    Sig: TAKE ONE TABLET BY MOUTH TWICE A DAY

## 2012-11-11 ENCOUNTER — Other Ambulatory Visit (INDEPENDENT_AMBULATORY_CARE_PROVIDER_SITE_OTHER): Payer: Medicare Other

## 2012-11-11 DIAGNOSIS — E559 Vitamin D deficiency, unspecified: Secondary | ICD-10-CM

## 2012-11-11 DIAGNOSIS — I10 Essential (primary) hypertension: Secondary | ICD-10-CM

## 2012-11-11 DIAGNOSIS — E785 Hyperlipidemia, unspecified: Secondary | ICD-10-CM

## 2012-11-11 LAB — HEPATIC FUNCTION PANEL
AST: 10 U/L (ref 0–37)
Albumin: 4.3 g/dL (ref 3.5–5.2)
Alkaline Phosphatase: 56 U/L (ref 39–117)
Indirect Bilirubin: 0.4 mg/dL (ref 0.0–0.9)
Total Protein: 6.1 g/dL (ref 6.0–8.3)

## 2012-11-11 LAB — POCT CBC
HCT, POC: 41.6 % — AB (ref 43.5–53.7)
Lymph, poc: 1.5 (ref 0.6–3.4)
MCH, POC: 29.1 pg (ref 27–31.2)
MCHC: 34.8 g/dL (ref 31.8–35.4)
MCV: 83.5 fL (ref 80–97)
POC Granulocyte: 3.4 (ref 2–6.9)
POC LYMPH PERCENT: 28.9 %L (ref 10–50)
RDW, POC: 14 %

## 2012-11-11 LAB — BASIC METABOLIC PANEL WITH GFR
BUN: 17 mg/dL (ref 6–23)
Calcium: 9.3 mg/dL (ref 8.4–10.5)
Creat: 1.01 mg/dL (ref 0.50–1.35)
GFR, Est African American: 82 mL/min
Glucose, Bld: 115 mg/dL — ABNORMAL HIGH (ref 70–99)
Sodium: 138 mEq/L (ref 135–145)

## 2012-11-11 NOTE — Progress Notes (Signed)
Pt came in for labs only 

## 2012-11-13 ENCOUNTER — Ambulatory Visit (INDEPENDENT_AMBULATORY_CARE_PROVIDER_SITE_OTHER): Payer: Medicare Other | Admitting: Family Medicine

## 2012-11-13 ENCOUNTER — Encounter: Payer: Self-pay | Admitting: Family Medicine

## 2012-11-13 VITALS — BP 128/67 | HR 80 | Temp 99.6°F | Ht 68.5 in | Wt 190.8 lb

## 2012-11-13 DIAGNOSIS — M1990 Unspecified osteoarthritis, unspecified site: Secondary | ICD-10-CM

## 2012-11-13 DIAGNOSIS — Z8546 Personal history of malignant neoplasm of prostate: Secondary | ICD-10-CM

## 2012-11-13 DIAGNOSIS — I10 Essential (primary) hypertension: Secondary | ICD-10-CM

## 2012-11-13 DIAGNOSIS — I350 Nonrheumatic aortic (valve) stenosis: Secondary | ICD-10-CM

## 2012-11-13 DIAGNOSIS — Z85118 Personal history of other malignant neoplasm of bronchus and lung: Secondary | ICD-10-CM

## 2012-11-13 DIAGNOSIS — E785 Hyperlipidemia, unspecified: Secondary | ICD-10-CM

## 2012-11-13 DIAGNOSIS — I359 Nonrheumatic aortic valve disorder, unspecified: Secondary | ICD-10-CM

## 2012-11-13 DIAGNOSIS — M129 Arthropathy, unspecified: Secondary | ICD-10-CM

## 2012-11-13 LAB — NMR LIPOPROFILE WITH LIPIDS
HDL Particle Number: 32.1 umol/L (ref >=30.5)
LDL Size: 21.1 nm (ref >20.5)
Large HDL-P: 6.6 umol/L (ref >=4.8)
Large VLDL-P: <0.8 nmol/L (ref <=2.7)
Small LDL Particle Number: 609 nmol/L — ABNORMAL HIGH (ref <=527)

## 2012-11-13 NOTE — Progress Notes (Addendum)
  Subjective:    Patient ID: Craig Dean, male    DOB: 01-Nov-1931, 79 y.o.   MRN: [id number]  HPI Patient comes in today for followup of chronic medical problems which include hyperlipidemia, ASCVD, and a remote history of lung cancer. As of note he is also statin intolerant. See also the review of systems. His health maintenance appears to be up-to-date. He sees a cardiologist on a yearly basis   Review of Systems  Constitutional: Positive for fatigue (intermitent).  HENT: Positive for postnasal drip (in the AM). Negative for sore throat.   Eyes: Negative.   Respiratory: Positive for shortness of breath (normal due to lobectomy). Negative for chest tightness.   Cardiovascular: Negative.   Gastrointestinal: Negative.   Endocrine: Negative.   Genitourinary: Positive for frequency (due to meds).  Musculoskeletal: Positive for back pain and arthralgias (arthritis "all over").  Skin: Negative.   Allergic/Immunologic: Positive for environmental allergies (seasonal).  Neurological: Negative.   Hematological: Bruises/bleeds easily.  Psychiatric/Behavioral: Positive for sleep disturbance (slight).       Objective:   Physical Exam BP 128/67  Pulse 80  Temp(Src) 99.6 F (37.6 C) (Oral)  Ht 5' 8.5" (1.74 m)  Wt 190 lb 12.8 oz (86.546 kg)  BMI 28.59 kg/m2  The patient appeared well nourished and normally developed, alert and oriented to time and place. Speech, behavior and judgement appear normal. Vital signs as documented.  Head exam is unremarkable. No scleral icterus or pallor noted. There is nasal congestion bilaterally. In [place] in the right EAC. Neck is without jugular venous distension, thyromegally, or carotid bruits. Carotid upstrokes are brisk bilaterally. No cervical adenopathy. The left lung is clear anteriorly and posteriorly to auscultation. Normal respiratory effort. There is atrophy of the right chest wall  Cardiac exam reveals regular rate and rhythm. First and  second heart sounds normal.  No  rubs or gallops. There is a grade 4/6 systolic ejection murmur secondary to the aortic valve stenosis . Abdominal exam reveals normal bowl sounds, no masses, no organomegaly and no aortic enlargement. No inguinal adenopathy. No abdominal tenderness. Extremities are nonedematous and both femoral are normal. Skin without pallor or jaundice.  Warm and dry, without rash. Neurologic exam reveals normal deep tendon reflexes and normal sensation.  Recent labs were reviewed with patient. Generally a blood sugar was up and LDL particle number was up. He knows he has to do all he can do with therapeutic lifestyle changes        Assessment & Plan:  1. Hyperlipemia  2. Hypertension  3. History of lung cancer  4. Arthritis  5. Personal history of prostate cancer  6. Aortic valve stenosis -Followed by the cardiologist  7. Statin intolerant  Labs will be drawn to evaluate associated diagnoses  Patient Instructions  Fall precautions discussed Continue current meds and therapeutic lifestyle changes Avoid situations which would not be good for you respiratory-wise by using protective equipment

## 2012-11-13 NOTE — Patient Instructions (Addendum)
Fall precautions discussed Continue current meds and therapeutic lifestyle changes Avoid situations which would not be good for you respiratory-wise by using protective equipment

## 2012-11-14 ENCOUNTER — Telehealth: Payer: Self-pay | Admitting: Family Medicine

## 2012-11-27 NOTE — Telephone Encounter (Signed)
Patient discussed results at office visit.

## 2013-03-18 ENCOUNTER — Ambulatory Visit (INDEPENDENT_AMBULATORY_CARE_PROVIDER_SITE_OTHER): Payer: Medicare Other

## 2013-03-18 DIAGNOSIS — Z23 Encounter for immunization: Secondary | ICD-10-CM

## 2013-04-12 ENCOUNTER — Other Ambulatory Visit: Payer: Self-pay | Admitting: Cardiology

## 2013-04-24 ENCOUNTER — Other Ambulatory Visit (INDEPENDENT_AMBULATORY_CARE_PROVIDER_SITE_OTHER): Payer: Medicare Other

## 2013-04-24 DIAGNOSIS — I10 Essential (primary) hypertension: Secondary | ICD-10-CM

## 2013-04-24 DIAGNOSIS — E785 Hyperlipidemia, unspecified: Secondary | ICD-10-CM

## 2013-04-24 DIAGNOSIS — E559 Vitamin D deficiency, unspecified: Secondary | ICD-10-CM

## 2013-04-24 NOTE — Progress Notes (Signed)
Pt came in for labs only 

## 2013-04-24 NOTE — Addendum Note (Signed)
Addended by: Tommas Olp on: 04/24/2013 11:13 AM   Modules accepted: Orders

## 2013-04-25 LAB — CBC WITH DIFFERENTIAL/PLATELET
Basophils Absolute: 0 10*3/uL (ref 0.0–0.2)
Eos: 1 %
HCT: 41.5 % (ref 37.5–51.0)
Immature Grans (Abs): 0 10*3/uL (ref 0.0–0.1)
Immature Granulocytes: 0 %
Lymphocytes Absolute: 1.1 10*3/uL (ref 0.7–3.1)
Lymphs: 27 %
MCHC: 34.5 g/dL (ref 31.5–35.7)
Monocytes: 10 %
Neutrophils Relative %: 62 %
RDW: 13.8 % (ref 12.3–15.4)
WBC: 4.2 10*3/uL (ref 3.4–10.8)

## 2013-04-25 LAB — HEPATIC FUNCTION PANEL
ALT: 8 IU/L (ref 0–44)
Alkaline Phosphatase: 65 IU/L (ref 39–117)
Bilirubin, Direct: 0.16 mg/dL (ref 0.00–0.40)
Total Protein: 6.5 g/dL (ref 6.0–8.5)

## 2013-04-25 LAB — NMR, LIPOPROFILE
Cholesterol: 243 mg/dL — ABNORMAL HIGH (ref <200)
HDL Cholesterol by NMR: 77 mg/dL (ref >=40)
HDL Particle Number: 40.5 umol/L (ref >=30.5)
LP-IR Score: <25 (ref <=45)
Small LDL Particle Number: 130 nmol/L (ref <=527)

## 2013-04-25 LAB — VITAMIN D 25 HYDROXY (VIT D DEFICIENCY, FRACTURES): Vit D, 25-Hydroxy: 52.1 ng/mL (ref 30.0–100.0)

## 2013-04-25 LAB — BMP8+EGFR
BUN/Creatinine Ratio: 9 — ABNORMAL LOW (ref 10–22)
CO2: 25 mmol/L (ref 18–29)
Chloride: 93 mmol/L — ABNORMAL LOW (ref 97–108)
GFR calc Af Amer: 70 mL/min/{1.73_m2} (ref >59)
Sodium: 140 mmol/L (ref 134–144)

## 2013-04-28 ENCOUNTER — Encounter: Payer: Self-pay | Admitting: Family Medicine

## 2013-04-28 ENCOUNTER — Ambulatory Visit (INDEPENDENT_AMBULATORY_CARE_PROVIDER_SITE_OTHER): Payer: Medicare Other | Admitting: Family Medicine

## 2013-04-28 VITALS — BP 159/82 | HR 84 | Temp 98.5°F | Ht 68.5 in | Wt 187.0 lb

## 2013-04-28 DIAGNOSIS — E785 Hyperlipidemia, unspecified: Secondary | ICD-10-CM

## 2013-04-28 DIAGNOSIS — I2510 Atherosclerotic heart disease of native coronary artery without angina pectoris: Secondary | ICD-10-CM

## 2013-04-28 DIAGNOSIS — Z9079 Acquired absence of other genital organ(s): Secondary | ICD-10-CM | POA: Insufficient documentation

## 2013-04-28 DIAGNOSIS — I509 Heart failure, unspecified: Secondary | ICD-10-CM

## 2013-04-28 DIAGNOSIS — I10 Essential (primary) hypertension: Secondary | ICD-10-CM

## 2013-04-28 DIAGNOSIS — Z23 Encounter for immunization: Secondary | ICD-10-CM

## 2013-04-28 DIAGNOSIS — E559 Vitamin D deficiency, unspecified: Secondary | ICD-10-CM | POA: Insufficient documentation

## 2013-04-28 DIAGNOSIS — Z8719 Personal history of other diseases of the digestive system: Secondary | ICD-10-CM

## 2013-04-28 DIAGNOSIS — I4891 Unspecified atrial fibrillation: Secondary | ICD-10-CM

## 2013-04-28 DIAGNOSIS — Z9889 Other specified postprocedural states: Secondary | ICD-10-CM

## 2013-04-28 DIAGNOSIS — Z85118 Personal history of other malignant neoplasm of bronchus and lung: Secondary | ICD-10-CM | POA: Insufficient documentation

## 2013-04-28 LAB — SPECIMEN STATUS REPORT

## 2013-04-28 NOTE — Addendum Note (Signed)
Addended by: Magdalene River on: 04/28/2013 11:48 AM   Modules accepted: Orders

## 2013-04-28 NOTE — Progress Notes (Signed)
Subjective:    Patient ID: Craig Dean, male    DOB: [birth year], 79 y.o.   MRN: [id number]  HPI Pt here for follow up and management of chronic medical problems. Patient is doing well. Health maintenance issues he is up to date on everything except for getting a rectal exam. He will get a Prevnar shot. His most recent labs will be reviewed with him. The patient also sees the cardiologist yearly.       Patient Active Problem List   Diagnosis Date Noted  . AORTIC VALVE DISORDERS 07/14/2009  . EDEMA 07/14/2009  . CHF 06/09/2009  . HYPERLIPIDEMIA 05/28/2009  . HYPERTENSION 05/28/2009  . CAD 05/28/2009  . ATRIAL FIBRILLATION 05/28/2009  . ARTHRITIS 05/28/2009   Outpatient Encounter Prescriptions as of 04/28/2013  Medication Sig  . acetaminophen (TYLENOL ARTHRITIS PAIN) 650 MG CR tablet Take 650 mg by mouth every 8 (eight) hours as needed.    Marland Kitchen aspirin 81 MG tablet Take 81 mg by mouth daily.    . cholecalciferol (VITAMIN D) 1000 UNITS tablet Take 1,000 Units by mouth 2 (two) times daily.    Marland Kitchen diltiazem (TIAZAC) 300 MG 24 hr capsule TAKE 1 CAPSULE (300 MG TOTAL) BY MOUTH DAILY.  Marland Kitchen enalapril (VASOTEC) 20 MG tablet TAKE ONE TABLET BY MOUTH TWICE A DAY  . hydrochlorothiazide (MICROZIDE) 12.5 MG capsule TAKE 1 TABLET (12.5 MG TOTAL) BY MOUTH DAILY.  . [DISCONTINUED] diltiazem (TIAZAC) 300 MG 24 hr capsule TAKE 1 CAPSULE (300 MG TOTAL) BY MOUTH DAILY.    Review of Systems  Constitutional: Negative.   HENT: Negative.   Eyes: Negative.   Respiratory: Negative.   Cardiovascular: Negative.   Gastrointestinal: Negative.   Endocrine: Negative.   Genitourinary: Negative.   Musculoskeletal: Negative.   Skin: Negative.   Allergic/Immunologic: Negative.   Neurological: Negative.   Hematological: Negative.   Psychiatric/Behavioral: Negative.        Objective:   Physical Exam  Nursing note and vitals reviewed. Constitutional: He is oriented to person, place, and time. He  appears well-developed and well-nourished.  Somewhat asymmetric chest secondary to right lobectomy  HENT:  Head: Normocephalic and atraumatic.  Right Ear: External ear normal.  Left Ear: External ear normal.  Nose: Nose normal.  Mouth/Throat: Oropharynx is clear and moist. No oropharyngeal exudate.  Eyes: Conjunctivae and EOM are normal. Pupils are equal, round, and reactive to light. Right eye exhibits no discharge. Left eye exhibits no discharge. No scleral icterus.  Neck: Normal range of motion. Neck supple. No tracheal deviation present. No thyromegaly present.  Patient has a left carotid bruit  Cardiovascular: Normal rate, regular rhythm and intact distal pulses.  Exam reveals no gallop and no friction rub.   Murmur heard. At 84 per minute with a grade 2/6 systolic ejection murmur  Pulmonary/Chest: Effort normal. No respiratory distress. He has no wheezes. He has no rales. He exhibits no tenderness.  Diminished breath sounds right chest secondary to lobectomy,no axillary nodes  Abdominal: Soft. Bowel sounds are normal. He exhibits no distension and no mass. There is tenderness (generally slightly tender). There is no rebound and no guarding.  Genitourinary: Rectum normal, prostate normal and penis normal.  Patient has bilateral hydroceles, left greater than right Prostate was not enlarged and the area where the prostate was was smooth without any masses. There were no rectal masses.  Musculoskeletal: Normal range of motion. He exhibits no edema and no tenderness.  Lymphadenopathy:    He has no cervical adenopathy.  Neurological: He is alert and oriented to person, place, and time. He has normal reflexes. No cranial nerve deficit.  Skin: Skin is warm and dry. No rash noted. No erythema. No pallor.  Psychiatric: He has a normal mood and affect. His behavior is normal. Judgment and thought content normal.   BP 159/82  Pulse 84  Temp(Src) 98.5 F (36.9 C) (Oral)  Ht 5' 8.5" (1.74 m)   Wt 187 lb (84.823 kg)  BMI 28.02 kg/m2 Repeat blood pressure right arm sitting 148/72       Assessment & Plan:  1. Atrial fibrillation  2. CAD  3. CHF  4. HYPERLIPIDEMIA  5. HYPERTENSION  6. Vitamin D deficiency  7. History of lung cancer  8. History of cholelithiasis  9. History of prostatectomy  No orders of the defined types were placed in this encounter.   Patient Instructions  Continue current medications. Continue good therapeutic lifestyle changes which include good diet and exercise. Fall precautions discussed with patient. Schedule your flu vaccine if you haven't had it yet If you are over 69 years old - you may need Prevnar 13 or the adult Pneumonia vaccine. Try to monitor your blood pressures at home a little bit more closely. Watch your sodium intake Try to keep watching your diet and staying as active as possible Use a cool mist humidifier in her bedroom at night   Nyra Capes MD

## 2013-04-28 NOTE — Patient Instructions (Addendum)
Continue current medications. Continue good therapeutic lifestyle changes which include good diet and exercise. Fall precautions discussed with patient. Schedule your flu vaccine if you haven't had it yet If you are over 77 years old - you may need Prevnar 13 or the adult Pneumonia vaccine. Try to monitor your blood pressures at home a little bit more closely. Watch your sodium intake Try to keep watching your diet and staying as active as possible Use a cool mist humidifier in her bedroom at night

## 2013-05-01 LAB — PSA, TOTAL AND FREE
PSA, Free: 0.67 ng/mL
PSA: 2.9 ng/mL (ref 0.0–4.0)

## 2013-05-13 ENCOUNTER — Other Ambulatory Visit: Payer: Self-pay | Admitting: Cardiology

## 2013-05-29 ENCOUNTER — Other Ambulatory Visit (INDEPENDENT_AMBULATORY_CARE_PROVIDER_SITE_OTHER): Payer: Medicare Other

## 2013-05-29 DIAGNOSIS — N390 Urinary tract infection, site not specified: Secondary | ICD-10-CM

## 2013-05-29 DIAGNOSIS — R7989 Other specified abnormal findings of blood chemistry: Secondary | ICD-10-CM

## 2013-05-29 LAB — POCT UA - MICROSCOPIC ONLY
Bacteria, U Microscopic: NEGATIVE
CRYSTALS, UR, HPF, POC: NEGATIVE
Casts, Ur, LPF, POC: NEGATIVE
WBC, UR, HPF, POC: NEGATIVE
Yeast, UA: NEGATIVE

## 2013-05-29 LAB — POCT URINALYSIS DIPSTICK
Bilirubin, UA: NEGATIVE
GLUCOSE UA: NEGATIVE
Ketones, UA: NEGATIVE
Leukocytes, UA: NEGATIVE
NITRITE UA: NEGATIVE
Spec Grav, UA: 1.01
UROBILINOGEN UA: NEGATIVE
pH, UA: 6

## 2013-05-29 NOTE — Progress Notes (Signed)
Patient came in for labs only.

## 2013-05-30 LAB — PSA, TOTAL AND FREE
PSA FREE: 0.86 ng/mL
PSA, Free Pct: 26.1 %
PSA: 3.3 ng/mL (ref 0.0–4.0)

## 2013-06-12 ENCOUNTER — Other Ambulatory Visit: Payer: Self-pay | Admitting: Cardiology

## 2013-07-04 ENCOUNTER — Telehealth: Payer: Self-pay | Admitting: Family Medicine

## 2013-07-05 MED ORDER — ENALAPRIL MALEATE 20 MG PO TABS: 20.0000 mg | ORAL_TABLET | Freq: Two times a day (BID) | ORAL | Status: DC

## 2013-07-07 NOTE — Telephone Encounter (Signed)
Patient aware.

## 2013-08-12 ENCOUNTER — Other Ambulatory Visit: Payer: Self-pay

## 2013-08-12 MED ORDER — HYDROCHLOROTHIAZIDE 12.5 MG PO CAPS: ORAL_CAPSULE | ORAL | Status: DC

## 2013-10-09 ENCOUNTER — Other Ambulatory Visit (INDEPENDENT_AMBULATORY_CARE_PROVIDER_SITE_OTHER): Payer: Medicare Other

## 2013-10-09 ENCOUNTER — Other Ambulatory Visit: Payer: Self-pay

## 2013-10-09 DIAGNOSIS — I10 Essential (primary) hypertension: Secondary | ICD-10-CM

## 2013-10-09 DIAGNOSIS — R350 Frequency of micturition: Secondary | ICD-10-CM

## 2013-10-09 DIAGNOSIS — E559 Vitamin D deficiency, unspecified: Secondary | ICD-10-CM

## 2013-10-09 DIAGNOSIS — R5383 Other fatigue: Principal | ICD-10-CM

## 2013-10-09 DIAGNOSIS — R5381 Other malaise: Secondary | ICD-10-CM

## 2013-10-09 DIAGNOSIS — C61 Malignant neoplasm of prostate: Secondary | ICD-10-CM

## 2013-10-09 DIAGNOSIS — E785 Hyperlipidemia, unspecified: Secondary | ICD-10-CM

## 2013-10-09 LAB — POCT UA - MICROSCOPIC ONLY
BACTERIA, U MICROSCOPIC: NEGATIVE
CASTS, UR, LPF, POC: NEGATIVE
Crystals, Ur, HPF, POC: NEGATIVE
Mucus, UA: NEGATIVE
WBC, UR, HPF, POC: NEGATIVE
Yeast, UA: NEGATIVE

## 2013-10-09 LAB — POCT URINALYSIS DIPSTICK
BILIRUBIN UA: NEGATIVE
GLUCOSE UA: NEGATIVE
Ketones, UA: NEGATIVE
Leukocytes, UA: NEGATIVE
NITRITE UA: NEGATIVE
Protein, UA: NEGATIVE
Spec Grav, UA: 1.02
Urobilinogen, UA: NEGATIVE
pH, UA: 5

## 2013-10-09 LAB — POCT CBC
Granulocyte percent: 67.5 %G (ref 37–80)
HCT, POC: 43.3 % — AB (ref 43.5–53.7)
HEMOGLOBIN: 13.9 g/dL — AB (ref 14.1–18.1)
Lymph, poc: 1.7 (ref 0.6–3.4)
MCH: 27.2 pg (ref 27–31.2)
MCHC: 32.1 g/dL (ref 31.8–35.4)
MCV: 84.8 fL (ref 80–97)
MPV: 6.6 fL (ref 0–99.8)
POC Granulocyte: 3.6 (ref 2–6.9)
POC LYMPH %: 30.7 % (ref 10–50)
Platelet Count, POC: 174 10*3/uL (ref 142–424)
RBC: 5.1 M/uL (ref 4.69–6.13)
RDW, POC: 14.2 %
WBC: 5.4 10*3/uL (ref 4.6–10.2)

## 2013-10-09 MED ORDER — DILTIAZEM HCL ER BEADS 300 MG PO CP24: ORAL_CAPSULE | ORAL | Status: DC

## 2013-10-09 NOTE — Progress Notes (Signed)
PAtient came in for labs only 

## 2013-10-10 LAB — BMP8+EGFR
BUN/Creatinine Ratio: 19 (ref 10–22)
BUN: 24 mg/dL (ref 8–27)
CHLORIDE: 97 mmol/L (ref 97–108)
CO2: 29 mmol/L (ref 18–29)
Calcium: 9.9 mg/dL (ref 8.6–10.2)
Creatinine, Ser: 1.28 mg/dL — ABNORMAL HIGH (ref 0.76–1.27)
GFR calc Af Amer: 61 mL/min/{1.73_m2} (ref >59)
GFR, EST NON AFRICAN AMERICAN: 53 mL/min/{1.73_m2} — AB (ref >59)
Glucose: 117 mg/dL — ABNORMAL HIGH (ref 65–99)
Potassium: 4.8 mmol/L (ref 3.5–5.2)
Sodium: 139 mmol/L (ref 134–144)

## 2013-10-10 LAB — NMR, LIPOPROFILE
CHOLESTEROL: 221 mg/dL — AB (ref 100–199)
HDL CHOLESTEROL BY NMR: 67 mg/dL (ref >39)
HDL PARTICLE NUMBER: 38.7 umol/L (ref >=30.5)
LDL Particle Number: 1497 nmol/L — ABNORMAL HIGH (ref <1000)
LDL Size: 21.3 nm (ref >20.5)
LDLC SERPL CALC-MCNC: 131 mg/dL — ABNORMAL HIGH (ref 0–99)
LP-IR Score: <25 (ref <=45)
Small LDL Particle Number: 507 nmol/L (ref <=527)
Triglycerides by NMR: 115 mg/dL (ref 0–149)

## 2013-10-10 LAB — PSA, TOTAL AND FREE
PSA FREE PCT: 21.1 %
PSA, Free: 0.76 ng/mL
PSA: 3.6 ng/mL (ref 0.0–4.0)

## 2013-10-10 LAB — HEPATIC FUNCTION PANEL
ALBUMIN: 4.7 g/dL (ref 3.5–4.8)
ALT: 10 IU/L (ref 0–44)
AST: 13 IU/L (ref 0–40)
Alkaline Phosphatase: 67 IU/L (ref 39–117)
BILIRUBIN TOTAL: 0.5 mg/dL (ref 0.0–1.2)
Bilirubin, Direct: 0.15 mg/dL (ref 0.00–0.40)
TOTAL PROTEIN: 6.5 g/dL (ref 6.0–8.5)

## 2013-10-10 LAB — VITAMIN D 25 HYDROXY (VIT D DEFICIENCY, FRACTURES): Vit D, 25-Hydroxy: 48.5 ng/mL (ref 30.0–100.0)

## 2013-10-13 ENCOUNTER — Encounter: Payer: Self-pay | Admitting: Family Medicine

## 2013-10-13 ENCOUNTER — Ambulatory Visit (INDEPENDENT_AMBULATORY_CARE_PROVIDER_SITE_OTHER): Payer: Medicare Other | Admitting: Family Medicine

## 2013-10-13 VITALS — BP 129/74 | HR 108 | Temp 99.1°F | Ht 68.5 in | Wt 181.0 lb

## 2013-10-13 DIAGNOSIS — Z9079 Acquired absence of other genital organ(s): Secondary | ICD-10-CM

## 2013-10-13 DIAGNOSIS — Z85118 Personal history of other malignant neoplasm of bronchus and lung: Secondary | ICD-10-CM

## 2013-10-13 DIAGNOSIS — I359 Nonrheumatic aortic valve disorder, unspecified: Secondary | ICD-10-CM

## 2013-10-13 DIAGNOSIS — H6121 Impacted cerumen, right ear: Secondary | ICD-10-CM

## 2013-10-13 DIAGNOSIS — E559 Vitamin D deficiency, unspecified: Secondary | ICD-10-CM

## 2013-10-13 DIAGNOSIS — Z9889 Other specified postprocedural states: Secondary | ICD-10-CM

## 2013-10-13 DIAGNOSIS — H6120 Impacted cerumen, unspecified ear: Secondary | ICD-10-CM

## 2013-10-13 DIAGNOSIS — I350 Nonrheumatic aortic (valve) stenosis: Secondary | ICD-10-CM

## 2013-10-13 DIAGNOSIS — I10 Essential (primary) hypertension: Secondary | ICD-10-CM

## 2013-10-13 DIAGNOSIS — E785 Hyperlipidemia, unspecified: Secondary | ICD-10-CM

## 2013-10-13 DIAGNOSIS — I2510 Atherosclerotic heart disease of native coronary artery without angina pectoris: Secondary | ICD-10-CM

## 2013-10-13 DIAGNOSIS — I4891 Unspecified atrial fibrillation: Secondary | ICD-10-CM

## 2013-10-13 MED ORDER — DILTIAZEM HCL ER BEADS 300 MG PO CP24: ORAL_CAPSULE | ORAL | Status: DC

## 2013-10-13 MED ORDER — HYDROCHLOROTHIAZIDE 12.5 MG PO CAPS: ORAL_CAPSULE | ORAL | Status: DC

## 2013-10-13 MED ORDER — ENALAPRIL MALEATE 20 MG PO TABS: 20.0000 mg | ORAL_TABLET | Freq: Two times a day (BID) | ORAL | Status: DC

## 2013-10-13 NOTE — Patient Instructions (Addendum)
Medicare Annual Wellness Visit  [facility] and the medical providers at [facility] strive to bring you the best medical care.  In doing so we not only want to address your current medical conditions and concerns but also to detect new conditions early and prevent illness, disease and health-related problems.    Medicare offers a yearly Wellness Visit which allows our clinical staff to assess your need for preventative services including immunizations, lifestyle education, counseling to decrease risk of preventable diseases and screening for fall risk and other medical concerns.    This visit is provided free of charge (no copay) for all Medicare recipients. The clinical pharmacists at [facility] have begun to conduct these Wellness Visits which will also include a thorough review of all your medications.    As you primary medical provider recommend that you make an appointment for your Annual Wellness Visit if you have not done so already this year.  You may set up this appointment before you leave today or you may call back ([phone]) and schedule an appointment.  Please make sure when you call that you mention that you are scheduling your Annual Wellness Visit with the clinical pharmacist so that the appointment may be made for the proper length of time.      Continue current medications. Continue good therapeutic lifestyle changes which include good diet and exercise. Fall precautions discussed with patient. If an FOBT was given today- please return it to our front desk. If you are over 66 years old - you may need Prevnar 40 or the adult Pneumonia vaccine.  We once again discussed your PSA results with the urologist. After that discussion I will have the nurse call you and let you know what his thoughts are regarding the increasing PSA. Continue aggressive therapeutic lifestyle changes You can purchase the Debrox ear drops  over-the-counter and you should use 2-3 drops to the affected ear for 2-3 nights in a row and wait 1 week and repeat the same procedure. This will help to soften the ear wax so that you can hear better

## 2013-10-13 NOTE — Progress Notes (Signed)
Subjective:    Patient ID: Craig Dean, male    DOB: July 20, 1931, 80 y.o.   MRN: [id number]  HPI Pt here for follow up and management of chronic medical problems. The patient is doing well with no particular complaints. He does complain of some general arthralgias and takes Tylenol for these aches and pains. He has not seen the cardiologist in a good while and prefers not to go back unless he has any particular problems. He has a history of lung cancer ASCVD and an elevated PSA in the past with subsequent prostatectomy secondary to recurrent infections and BPH. He also has a history of cholelithiasis.        Patient Active Problem List   Diagnosis Date Noted  . Vitamin D deficiency 04/28/2013  . History of lung cancer 04/28/2013  . History of cholelithiasis 04/28/2013  . History of prostatectomy 04/28/2013  . AORTIC VALVE DISORDERS 07/14/2009  . EDEMA 07/14/2009  . CHF 06/09/2009  . HYPERLIPIDEMIA 05/28/2009  . HYPERTENSION 05/28/2009  . CAD 05/28/2009  . ATRIAL FIBRILLATION 05/28/2009  . ARTHRITIS 05/28/2009   Outpatient Encounter Prescriptions as of 10/13/2013  Medication Sig  . acetaminophen (TYLENOL ARTHRITIS PAIN) 650 MG CR tablet Take 650 mg by mouth every 8 (eight) hours as needed.    Marland Kitchen aspirin 81 MG tablet Take 81 mg by mouth daily.    . cholecalciferol (VITAMIN D) 1000 UNITS tablet Take 1,000 Units by mouth 2 (two) times daily.    Marland Kitchen diltiazem (TIAZAC) 300 MG 24 hr capsule TAKE 1 CAPSULE (300 MG TOTAL) BY MOUTH DAILY.  Marland Kitchen enalapril (VASOTEC) 20 MG tablet Take 1 tablet (20 mg total) by mouth 2 (two) times daily.  . hydrochlorothiazide (MICROZIDE) 12.5 MG capsule TAKE 1 TABLET (12.5 MG TOTAL) BY MOUTH DAILY.    Review of Systems  Constitutional: Negative.   HENT: Negative.   Eyes: Negative.   Respiratory: Negative.   Cardiovascular: Negative.   Gastrointestinal: Negative.   Endocrine: Negative.   Genitourinary: Negative.   Musculoskeletal: Negative.   Skin:  Negative.   Allergic/Immunologic: Negative.   Neurological: Negative.   Hematological: Negative.   Psychiatric/Behavioral: Negative.        Objective:   Physical Exam  Nursing note and vitals reviewed. Constitutional: He is oriented to person, place, and time. He appears well-developed and well-nourished. No distress.  HENT:  Head: Normocephalic and atraumatic.  Right Ear: External ear normal.  Left Ear: External ear normal.  Nose: Nose normal.  Mouth/Throat: Oropharynx is clear and moist. No oropharyngeal exudate.  Eyes: Conjunctivae and EOM are normal. Pupils are equal, round, and reactive to light. Right eye exhibits no discharge. Left eye exhibits no discharge. No scleral icterus.  Neck: Normal range of motion. Neck supple. No thyromegaly present.  Cardiovascular: Normal rate, regular rhythm and intact distal pulses.  Exam reveals no gallop and no friction rub.   Murmur heard. BUN is slightly irregular at 96 per minute with a grade 3/6 systolic ejection murmur  Pulmonary/Chest: Effort normal. No respiratory distress. He has no wheezes. He has no rales. He exhibits no tenderness.  There are decreased breath sounds in the right lung base due to a history of lobectomy secondary to lung cancer  Abdominal: Soft. Bowel sounds are normal. He exhibits no mass. There is no tenderness. There is no rebound and no guarding.  There is no right upper quadrant tenderness  Musculoskeletal: Normal range of motion. He exhibits no edema and no tenderness.  Lymphadenopathy:  He has no cervical adenopathy.  Neurological: He is alert and oriented to person, place, and time. He has normal reflexes. No cranial nerve deficit.  Skin: Skin is warm and dry. No rash noted. No erythema. No pallor.  Psychiatric: He has a normal mood and affect. His behavior is normal. Judgment and thought content normal.   BP 129/74  Pulse 108  Temp(Src) 99.1 F (37.3 C) (Oral)  Ht 5' 8.5" (1.74 m)  Wt 181 lb (82.101  kg)  BMI 27.12 kg/m2        Assessment & Plan:  1. Atrial fibrillation  2. CAD  3. History of prostatectomy  4. History of lung cancer  5. HYPERLIPIDEMIA  6. HYPERTENSION  7. Vitamin D deficiency  8. Right ear impacted cerumen  9. Aortic valve stenosis   Patient Instructions                       Medicare Annual Wellness Visit  [facility] and the medical providers at [facility] strive to bring you the best medical care.  In doing so we not only want to address your current medical conditions and concerns but also to detect new conditions early and prevent illness, disease and health-related problems.    Medicare offers a yearly Wellness Visit which allows our clinical staff to assess your need for preventative services including immunizations, lifestyle education, counseling to decrease risk of preventable diseases and screening for fall risk and other medical concerns.    This visit is provided free of charge (no copay) for all Medicare recipients. The clinical pharmacists at [facility] have begun to conduct these Wellness Visits which will also include a thorough review of all your medications.    As you primary medical provider recommend that you make an appointment for your Annual Wellness Visit if you have not done so already this year.  You may set up this appointment before you leave today or you may call back [phone]) and schedule an appointment.  Please make sure when you call that you mention that you are scheduling your Annual Wellness Visit with the clinical pharmacist so that the appointment may be made for the proper length of time.      Continue current medications. Continue good therapeutic lifestyle changes which include good diet and exercise. Fall precautions discussed with patient. If an FOBT was given today- please return it to our front desk. If you are over 81 years old - you may need Prevnar 48 or  the adult Pneumonia vaccine.  We once again discussed your PSA results with the urologist. After that discussion I will have the nurse call you and let you know what his thoughts are regarding the increasing PSA. Continue aggressive therapeutic lifestyle changes You can purchase the Debrox ear drops over-the-counter and you should use 2-3 drops to the affected ear for 2-3 nights in a row and wait 1 week and repeat the same procedure. This will help to soften the ear wax so that you can hear better   Arrie Senate MD

## 2013-11-18 ENCOUNTER — Other Ambulatory Visit (INDEPENDENT_AMBULATORY_CARE_PROVIDER_SITE_OTHER): Payer: Medicare Other

## 2013-11-18 DIAGNOSIS — Z1212 Encounter for screening for malignant neoplasm of rectum: Secondary | ICD-10-CM

## 2013-11-18 DIAGNOSIS — Z125 Encounter for screening for malignant neoplasm of prostate: Secondary | ICD-10-CM

## 2013-11-19 LAB — PSA, TOTAL AND FREE
PSA FREE PCT: 24.9 %
PSA FREE: 1.07 ng/mL
PSA: 4.3 ng/mL — ABNORMAL HIGH (ref 0.0–4.0)

## 2013-11-21 ENCOUNTER — Other Ambulatory Visit: Payer: Self-pay | Admitting: Urology

## 2013-11-21 DIAGNOSIS — R972 Elevated prostate specific antigen [PSA]: Secondary | ICD-10-CM

## 2013-12-05 ENCOUNTER — Other Ambulatory Visit: Payer: Self-pay | Admitting: Urology

## 2013-12-05 DIAGNOSIS — R319 Hematuria, unspecified: Secondary | ICD-10-CM

## 2013-12-25 ENCOUNTER — Other Ambulatory Visit: Payer: Self-pay | Admitting: Urology

## 2013-12-25 DIAGNOSIS — R972 Elevated prostate specific antigen [PSA]: Secondary | ICD-10-CM

## 2013-12-26 ENCOUNTER — Encounter (HOSPITAL_COMMUNITY): Payer: Self-pay

## 2013-12-26 ENCOUNTER — Ambulatory Visit (HOSPITAL_COMMUNITY)
Admission: RE | Admit: 2013-12-26 | Discharge: 2013-12-26 | Disposition: A | Discharge status: Home / Self Care | Payer: Medicare Other | Source: Ambulatory Visit | Attending: Urology | Admitting: Urology

## 2013-12-26 VITALS — BP 114/85 | HR 100 | Temp 98.3°F | Resp 20

## 2013-12-26 DIAGNOSIS — R972 Elevated prostate specific antigen [PSA]: Secondary | ICD-10-CM | POA: Diagnosis present

## 2013-12-26 DIAGNOSIS — R319 Hematuria, unspecified: Secondary | ICD-10-CM | POA: Insufficient documentation

## 2013-12-26 LAB — POCT I-STAT CREATININE: CREATININE: 1.3 mg/dL (ref 0.50–1.35)

## 2013-12-26 MED ORDER — LIDOCAINE HCL (PF) 2 % IJ SOLN
10.0000 mL | Freq: Once | INTRAMUSCULAR | Status: AC
  Administered 2013-12-26: 10 mL

## 2013-12-26 MED ORDER — GENTAMICIN SULFATE 40 MG/ML IJ SOLN
INTRAMUSCULAR | Status: AC
  Administered 2013-12-26: 160 mg via INTRAMUSCULAR
  Filled 2013-12-26: qty 4

## 2013-12-26 MED ORDER — IOHEXOL 300 MG/ML  SOLN
125.0000 mL | Freq: Once | INTRAMUSCULAR | Status: AC | PRN
  Administered 2013-12-26: 125 mL via INTRAVENOUS

## 2013-12-26 MED ORDER — GENTAMICIN SULFATE 40 MG/ML IJ SOLN
160.0000 mg | Freq: Once | INTRAMUSCULAR | Status: AC
  Administered 2013-12-26: 160 mg via INTRAMUSCULAR
  Filled 2013-12-26: qty 4

## 2013-12-26 NOTE — Discharge Instructions (Signed)
Transrectal Ultrasound-Guided Biopsy A transrectal ultrasound-guided biopsy is a procedure to remove samples of tissue from your prostate using ultrasound images to guide the procedure. The procedure is usually done to evaluate the prostate gland of men who have an elevated prostate-specific antigen (PSA). PSA is a blood test to screen for prostate cancer. The biopsy samples are taken to check for prostate cancer.  LET [facility] CARE PROVIDER KNOW ABOUT:  Any allergies you have.  All medicines you are taking, including vitamins, herbs, eye drops, creams, and over-the-counter medicines.  Previous problems you or members of your family have had with the use of anesthetics.  Any blood disorders you have.  Previous surgeries you have had.  Medical conditions you have. RISKS AND COMPLICATIONS Generally, this is a safe procedure. However, as with any procedure, problems can occur. Possible problems include:  Infection of your prostate.  Bleeding from your rectum or blood in your urine.  Difficulty urinating.  Nerve damage (this is usually temporary).  Damage to surrounding structures such as blood vessels, organs, and muscles, which would require other procedures. BEFORE THE PROCEDURE  Do not eat or drink anything after midnight on the night before the procedure or as directed by your health care provider.  Take medicines only as directed by your health care provider.  Your health care provider may have you stop taking certain medicines 5-7 days before the procedure.  You will be given an enema before the procedure. During an enema, a liquid is injected into your rectum to clear out waste.  You may have lab tests the day of your procedure.   Plan to have someone take you home after the procedure. PROCEDURE   You will be given medicine to help you relax (sedative) before the procedure. An IV tube will be inserted into one of your veins and used to give fluids and  medicine.  You will be given antibiotic medicine to reduce the risk of an infection.  You will be placed on your side for the procedure.  A probe with lubricated gel will be placed into your rectum, and images will be taken of your prostate and surrounding structures.  Numbing medicine will be injected into the prostate before the biopsy samples are taken.  A biopsy needle will then be inserted and guided to your prostate with the use of the ultrasound images.  Samples of prostate tissue will be taken, and the needle will then be removed.  The biopsy samples will be sent to a lab to be analyzed. Results are usually back in 2-3 days. AFTER THE PROCEDURE  You will be taken to a recovery area where you will be monitored.  You may have some discomfort in the rectal area. You will be given pain medicines to control this.  You may be allowed to go home the same day, or you may need to stay in the hospital overnight. Document Released: 09/15/2013 Document Reviewed: 12/18/2012 [facility] Patient Information 2015 [place]. This information is not intended to replace advice given to you by your health care provider. Make sure you discuss any questions you have with your health care provider.

## 2014-01-01 ENCOUNTER — Other Ambulatory Visit: Payer: Self-pay | Admitting: Urology

## 2014-01-01 ENCOUNTER — Other Ambulatory Visit (HOSPITAL_COMMUNITY): Payer: Self-pay | Admitting: Family Medicine

## 2014-01-01 DIAGNOSIS — C61 Malignant neoplasm of prostate: Secondary | ICD-10-CM

## 2014-01-16 ENCOUNTER — Ambulatory Visit (HOSPITAL_COMMUNITY)
Admission: RE | Admit: 2014-01-16 | Discharge: 2014-01-16 | Disposition: A | Discharge status: Home / Self Care | Payer: Medicare Other | Source: Ambulatory Visit | Attending: Diagnostic Radiology | Admitting: Diagnostic Radiology

## 2014-01-16 ENCOUNTER — Encounter (HOSPITAL_COMMUNITY)
Admission: RE | Admit: 2014-01-16 | Discharge: 2014-01-16 | Disposition: A | Discharge status: Home / Self Care | Payer: Medicare Other | Source: Ambulatory Visit | Attending: Family Medicine | Admitting: Family Medicine

## 2014-01-16 DIAGNOSIS — C61 Malignant neoplasm of prostate: Secondary | ICD-10-CM | POA: Diagnosis present

## 2014-01-16 DIAGNOSIS — M549 Dorsalgia, unspecified: Secondary | ICD-10-CM | POA: Diagnosis not present

## 2014-01-16 MED ORDER — TECHNETIUM TC 99M MEDRONATE IV KIT
26.0000 | PACK | Freq: Once | INTRAVENOUS | Status: AC | PRN
  Administered 2014-01-16: 26 via INTRAVENOUS

## 2014-02-12 ENCOUNTER — Other Ambulatory Visit (INDEPENDENT_AMBULATORY_CARE_PROVIDER_SITE_OTHER): Payer: Medicare Other

## 2014-02-12 DIAGNOSIS — I10 Essential (primary) hypertension: Secondary | ICD-10-CM

## 2014-02-12 DIAGNOSIS — I4891 Unspecified atrial fibrillation: Secondary | ICD-10-CM

## 2014-02-12 DIAGNOSIS — E785 Hyperlipidemia, unspecified: Secondary | ICD-10-CM

## 2014-02-12 DIAGNOSIS — C61 Malignant neoplasm of prostate: Secondary | ICD-10-CM

## 2014-02-12 DIAGNOSIS — E559 Vitamin D deficiency, unspecified: Secondary | ICD-10-CM

## 2014-02-12 LAB — POCT CBC
GRANULOCYTE PERCENT: 61 % (ref 37–80)
HEMATOCRIT: 42.3 % — AB (ref 43.5–53.7)
Hemoglobin: 13.8 g/dL — AB (ref 14.1–18.1)
Lymph, poc: 1.8 (ref 0.6–3.4)
MCH, POC: 27.1 pg (ref 27–31.2)
MCHC: 32.6 g/dL (ref 31.8–35.4)
MCV: 83.3 fL (ref 80–97)
MPV: 6.8 fL (ref 0–99.8)
POC Granulocyte: 3.2 (ref 2–6.9)
POC LYMPH PERCENT: 34.7 %L (ref 10–50)
Platelet Count, POC: 171 10*3/uL (ref 142–424)
RBC: 5.1 M/uL (ref 4.69–6.13)
RDW, POC: 14.9 %
WBC: 5.2 10*3/uL (ref 4.6–10.2)

## 2014-02-12 NOTE — Progress Notes (Signed)
Lab only 

## 2014-02-13 LAB — BMP8+EGFR
BUN / CREAT RATIO: 21 (ref 10–22)
BUN: 26 mg/dL (ref 8–27)
CHLORIDE: 95 mmol/L — AB (ref 97–108)
CO2: 29 mmol/L (ref 18–29)
Calcium: 9.7 mg/dL (ref 8.6–10.2)
Creatinine, Ser: 1.25 mg/dL (ref 0.76–1.27)
GFR calc non Af Amer: 54 mL/min/{1.73_m2} — ABNORMAL LOW (ref >59)
GFR, EST AFRICAN AMERICAN: 63 mL/min/{1.73_m2} (ref >59)
Glucose: 113 mg/dL — ABNORMAL HIGH (ref 65–99)
Potassium: 4.8 mmol/L (ref 3.5–5.2)
SODIUM: 139 mmol/L (ref 134–144)

## 2014-02-13 LAB — HEPATIC FUNCTION PANEL
ALBUMIN: 4.5 g/dL (ref 3.5–4.8)
ALK PHOS: 72 IU/L (ref 39–117)
ALT: 11 IU/L (ref 0–44)
AST: 14 IU/L (ref 0–40)
Bilirubin, Direct: 0.25 mg/dL (ref 0.00–0.40)
Total Bilirubin: 0.7 mg/dL (ref 0.0–1.2)
Total Protein: 6.5 g/dL (ref 6.0–8.5)

## 2014-02-13 LAB — PSA, TOTAL AND FREE
PSA FREE: 0.93 ng/mL
PSA, Free Pct: 17.5 %
PSA: 5.3 ng/mL — AB (ref 0.0–4.0)

## 2014-02-13 LAB — NMR, LIPOPROFILE
Cholesterol: 228 mg/dL — ABNORMAL HIGH (ref 100–199)
HDL Cholesterol by NMR: 67 mg/dL (ref >39)
HDL Particle Number: 39.9 umol/L (ref >=30.5)
LDL Particle Number: 1432 nmol/L — ABNORMAL HIGH (ref <1000)
LDL Size: 21.3 nm (ref >20.5)
LDLC SERPL CALC-MCNC: 132 mg/dL — ABNORMAL HIGH (ref 0–99)
Small LDL Particle Number: 552 nmol/L — ABNORMAL HIGH (ref <=527)
Triglycerides by NMR: 144 mg/dL (ref 0–149)

## 2014-02-13 LAB — TESTOSTERONE,FREE AND TOTAL
Testosterone, Free: 12.5 pg/mL (ref 6.6–18.1)
Testosterone: 557 ng/dL (ref 348–1197)

## 2014-02-13 LAB — VITAMIN D 25 HYDROXY (VIT D DEFICIENCY, FRACTURES): Vit D, 25-Hydroxy: 55 ng/mL (ref 30.0–100.0)

## 2014-02-16 ENCOUNTER — Encounter: Payer: Self-pay | Admitting: Family Medicine

## 2014-02-16 ENCOUNTER — Ambulatory Visit (INDEPENDENT_AMBULATORY_CARE_PROVIDER_SITE_OTHER): Payer: Medicare Other

## 2014-02-16 ENCOUNTER — Ambulatory Visit (INDEPENDENT_AMBULATORY_CARE_PROVIDER_SITE_OTHER): Payer: Medicare Other | Admitting: Family Medicine

## 2014-02-16 VITALS — BP 121/69 | HR 98 | Temp 98.2°F | Ht 68.5 in | Wt 172.0 lb

## 2014-02-16 DIAGNOSIS — C61 Malignant neoplasm of prostate: Secondary | ICD-10-CM

## 2014-02-16 DIAGNOSIS — R972 Elevated prostate specific antigen [PSA]: Secondary | ICD-10-CM | POA: Insufficient documentation

## 2014-02-16 DIAGNOSIS — E785 Hyperlipidemia, unspecified: Secondary | ICD-10-CM

## 2014-02-16 DIAGNOSIS — Z902 Acquired absence of lung [part of]: Secondary | ICD-10-CM

## 2014-02-16 DIAGNOSIS — I4891 Unspecified atrial fibrillation: Secondary | ICD-10-CM

## 2014-02-16 DIAGNOSIS — E559 Vitamin D deficiency, unspecified: Secondary | ICD-10-CM

## 2014-02-16 DIAGNOSIS — Z23 Encounter for immunization: Secondary | ICD-10-CM

## 2014-02-16 DIAGNOSIS — I10 Essential (primary) hypertension: Secondary | ICD-10-CM

## 2014-02-16 DIAGNOSIS — Z9889 Other specified postprocedural states: Secondary | ICD-10-CM | POA: Insufficient documentation

## 2014-02-16 NOTE — Patient Instructions (Addendum)
Medicare Annual Wellness Visit  [facility] and the medical providers at [facility] strive to bring you the best medical care.  In doing so we not only want to address your current medical conditions and concerns but also to detect new conditions early and prevent illness, disease and health-related problems.    Medicare offers a yearly Wellness Visit which allows our clinical staff to assess your need for preventative services including immunizations, lifestyle education, counseling to decrease risk of preventable diseases and screening for fall risk and other medical concerns.    This visit is provided free of charge (no copay) for all Medicare recipients. The clinical pharmacists at [facility] have begun to conduct these Wellness Visits which will also include a thorough review of all your medications.    As you primary medical provider recommend that you make an appointment for your Annual Wellness Visit if you have not done so already this year.  You may set up this appointment before you leave today or you may call back [phone]) and schedule an appointment.  Please make sure when you call that you mention that you are scheduling your Annual Wellness Visit with the clinical pharmacist so that the appointment may be made for the proper length of time.     Continue current medications. Continue good therapeutic lifestyle changes which include good diet and exercise. Fall precautions discussed with patient. If an FOBT was given today- please return it to our front desk. If you are over 31 years old - you may need Prevnar 51 or the adult Pneumonia vaccine.  Flu Shots will be available at our office starting mid- September. Please call and schedule a FLU CLINIC APPOINTMENT.   Keep appointment with cardiology when this gets scheduled

## 2014-02-16 NOTE — Progress Notes (Signed)
Subjective:    Patient ID: Craig Dean, male    DOB: February 11, 1932, 80 y.o.   MRN: [id number]  HPI Pt here for follow and management of chronic medical problems. The patient is somewhat anxious in discussing the fact that he does not want to do anything about his aggressive prostate cancer. He had biopsies done in August. He has decided not to pursue any treatment for this. His family is aware of this. He has not seen the cardiologist recently .        Patient Active Problem List   Diagnosis Date Noted  . Vitamin D deficiency 04/28/2013  . History of lung cancer 04/28/2013  . History of cholelithiasis 04/28/2013  . History of prostatectomy 04/28/2013  . AORTIC VALVE DISORDERS 07/14/2009  . EDEMA 07/14/2009  . CHF 06/09/2009  . Hyperlipidemia 05/28/2009  . HTN (hypertension) 05/28/2009  . CAD 05/28/2009  . ATRIAL FIBRILLATION 05/28/2009  . ARTHRITIS 05/28/2009   Outpatient Encounter Prescriptions as of 02/16/2014  Medication Sig  . acetaminophen (TYLENOL ARTHRITIS PAIN) 650 MG CR tablet Take 650 mg by mouth every 8 (eight) hours as needed.    Marland Kitchen aspirin 81 MG tablet Take 81 mg by mouth daily.    . cholecalciferol (VITAMIN D) 1000 UNITS tablet Take 1,000 Units by mouth 2 (two) times daily.    Marland Kitchen diltiazem (TIAZAC) 300 MG 24 hr capsule TAKE 1 CAPSULE (300 MG TOTAL) BY MOUTH DAILY.  Marland Kitchen enalapril (VASOTEC) 20 MG tablet Take 1 tablet (20 mg total) by mouth 2 (two) times daily.  . hydrochlorothiazide (MICROZIDE) 12.5 MG capsule TAKE 1 TABLET (12.5 MG TOTAL) BY MOUTH DAILY.    Review of Systems  Constitutional: Negative.   HENT: Negative.   Eyes: Negative.   Respiratory: Negative.   Cardiovascular: Negative.   Gastrointestinal: Negative.   Endocrine: Negative.   Genitourinary: Negative.   Musculoskeletal: Negative.   Skin: Negative.   Allergic/Immunologic: Negative.   Neurological: Negative.   Hematological: Negative.   Psychiatric/Behavioral: Negative.          Objective:   Physical Exam  Nursing note and vitals reviewed. Constitutional: He is oriented to person, place, and time. He appears well-developed and well-nourished. He appears distressed.  The patient is apparently somewhat distressed in relating to me about his advanced prostate cancer. He presents to me the biopsy results from the urologist. This will be scanned into the record.  HENT:  Head: Normocephalic and atraumatic.  Right Ear: External ear normal.  Left Ear: External ear normal.  Nose: Nose normal.  Mouth/Throat: Oropharynx is clear and moist. No oropharyngeal exudate.  Eyes: Conjunctivae and EOM are normal. Pupils are equal, round, and reactive to light. Right eye exhibits no discharge. Left eye exhibits no discharge. No scleral icterus.  Neck: Normal range of motion. Neck supple. No thyromegaly present.  No anterior cervical nodes  Cardiovascular: Normal rate and intact distal pulses.  Exam reveals no gallop and no friction rub.   Murmur heard. The heart is slightly irregular at 84 per minute. There is a grade 4/6 systolic ejection murmur. Pedal pulses were slightly diminished bilateral, there were good inguinal pulses  Pulmonary/Chest: Effort normal. No respiratory distress. He has no wheezes. He has no rales. He exhibits no tenderness.  There were diminished breath sounds in the right chest secondary to his pneumonectomy which was secondary to lung cancer . There were no axillary nodes.  Abdominal: Soft. Bowel sounds are normal. He exhibits no mass. There is no tenderness.  There is no rebound and no guarding.  No abdominal bruits and no inguinal nodes.  Musculoskeletal: Normal range of motion. He exhibits no edema and no tenderness.  Lymphadenopathy:    He has no cervical adenopathy.  Neurological: He is alert and oriented to person, place, and time. He has normal reflexes. No cranial nerve deficit.  Skin: Skin is warm and dry. No rash noted. No erythema. No pallor.   Psychiatric: He has a normal mood and affect. His behavior is normal. Judgment and thought content normal.   BP 121/69  Pulse 98  Temp(Src) 98.2 F (36.8 C) (Oral)  Ht 5' 8.5" (1.74 m)  Wt 172 lb (78.019 kg)  BMI 25.77 kg/m2  [facility] reading (PRIMARY) by  Dr. Brunilda Payor x-ray-- absent right lung, otherwise no active disease                                       Assessment & Plan:  1. Atrial fibrillation, unspecified  2. Hyperlipidemia  3. Essential hypertension - DG Chest 2 View; Future  4. Vitamin D deficiency  5. Encounter for immunization  6. Elevated PSA  7. Prostate cancer -The patient was made aware of this by the urologist and has elected not to pursue any further treatment  8. H/O pneumonectomy -Secondary to lung cancer  No orders of the defined types were placed in this encounter.   Patient Instructions                       Medicare Annual Wellness Visit  [facility] and the medical providers at [facility] strive to bring you the best medical care.  In doing so we not only want to address your current medical conditions and concerns but also to detect new conditions early and prevent illness, disease and health-related problems.    Medicare offers a yearly Wellness Visit which allows our clinical staff to assess your need for preventative services including immunizations, lifestyle education, counseling to decrease risk of preventable diseases and screening for fall risk and other medical concerns.    This visit is provided free of charge (no copay) for all Medicare recipients. The clinical pharmacists at [facility] have begun to conduct these Wellness Visits which will also include a thorough review of all your medications.    As you primary medical provider recommend that you make an appointment for your Annual Wellness Visit if you have not done so already this year.  You may set up this appointment before you  leave today or you may call back [phone]) and schedule an appointment.  Please make sure when you call that you mention that you are scheduling your Annual Wellness Visit with the clinical pharmacist so that the appointment may be made for the proper length of time.     Continue current medications. Continue good therapeutic lifestyle changes which include good diet and exercise. Fall precautions discussed with patient. If an FOBT was given today- please return it to our front desk. If you are over 11 years old - you may need Prevnar 52 or the adult Pneumonia vaccine.  Flu Shots will be available at our office starting mid- September. Please call and schedule a FLU CLINIC APPOINTMENT.   Keep appointment with cardiology when this gets scheduled   Arrie Senate MD

## 2014-02-17 ENCOUNTER — Telehealth: Payer: Self-pay

## 2014-02-17 NOTE — Telephone Encounter (Signed)
Pt aware of CXR results.

## 2014-02-17 NOTE — Telephone Encounter (Signed)
Message copied by Koren Bound on Tue Feb 17, 2014  8:15 AM ------      Message from: Chipper Herb      Created: Mon Feb 16, 2014 12:56 PM       As per radiology report ------

## 2014-04-22 ENCOUNTER — Other Ambulatory Visit: Payer: Self-pay | Admitting: Family Medicine

## 2014-05-03 ENCOUNTER — Other Ambulatory Visit: Payer: Self-pay | Admitting: Family Medicine

## 2014-05-12 ENCOUNTER — Other Ambulatory Visit: Payer: Self-pay | Admitting: Family Medicine

## 2014-06-16 ENCOUNTER — Other Ambulatory Visit: Payer: Self-pay | Admitting: Family Medicine

## 2014-06-22 ENCOUNTER — Other Ambulatory Visit (INDEPENDENT_AMBULATORY_CARE_PROVIDER_SITE_OTHER): Payer: Medicare Other

## 2014-06-22 DIAGNOSIS — I10 Essential (primary) hypertension: Secondary | ICD-10-CM

## 2014-06-22 DIAGNOSIS — E559 Vitamin D deficiency, unspecified: Secondary | ICD-10-CM

## 2014-06-22 DIAGNOSIS — M1990 Unspecified osteoarthritis, unspecified site: Secondary | ICD-10-CM

## 2014-06-22 DIAGNOSIS — C61 Malignant neoplasm of prostate: Secondary | ICD-10-CM

## 2014-06-22 DIAGNOSIS — E785 Hyperlipidemia, unspecified: Secondary | ICD-10-CM

## 2014-06-22 DIAGNOSIS — Z85118 Personal history of other malignant neoplasm of bronchus and lung: Secondary | ICD-10-CM

## 2014-06-22 LAB — POCT CBC
GRANULOCYTE PERCENT: 64.3 % (ref 37–80)
HEMATOCRIT: 45.5 % (ref 43.5–53.7)
HEMOGLOBIN: 13.7 g/dL — AB (ref 14.1–18.1)
LYMPH, POC: 1.9 (ref 0.6–3.4)
MCH, POC: 26 pg — AB (ref 27–31.2)
MCHC: 30.2 g/dL — AB (ref 31.8–35.4)
MCV: 86 fL (ref 80–97)
MPV: 6.9 fL (ref 0–99.8)
POC GRANULOCYTE: 4 (ref 2–6.9)
POC LYMPH PERCENT: 31.2 %L (ref 10–50)
Platelet Count, POC: 154 10*3/uL (ref 142–424)
RBC: 5.3 M/uL (ref 4.69–6.13)
RDW, POC: 15.5 %
WBC: 6.2 10*3/uL (ref 4.6–10.2)

## 2014-06-23 ENCOUNTER — Encounter: Payer: Self-pay | Admitting: Family Medicine

## 2014-06-23 ENCOUNTER — Ambulatory Visit (INDEPENDENT_AMBULATORY_CARE_PROVIDER_SITE_OTHER): Payer: Medicare Other | Admitting: Family Medicine

## 2014-06-23 VITALS — BP 134/70 | HR 89 | Temp 98.0°F | Ht 68.5 in | Wt 180.0 lb

## 2014-06-23 DIAGNOSIS — R972 Elevated prostate specific antigen [PSA]: Secondary | ICD-10-CM

## 2014-06-23 DIAGNOSIS — E559 Vitamin D deficiency, unspecified: Secondary | ICD-10-CM

## 2014-06-23 DIAGNOSIS — I10 Essential (primary) hypertension: Secondary | ICD-10-CM

## 2014-06-23 DIAGNOSIS — M25552 Pain in left hip: Secondary | ICD-10-CM

## 2014-06-23 DIAGNOSIS — M25569 Pain in unspecified knee: Secondary | ICD-10-CM

## 2014-06-23 DIAGNOSIS — C61 Malignant neoplasm of prostate: Secondary | ICD-10-CM

## 2014-06-23 DIAGNOSIS — M25561 Pain in right knee: Secondary | ICD-10-CM

## 2014-06-23 DIAGNOSIS — M25551 Pain in right hip: Secondary | ICD-10-CM

## 2014-06-23 DIAGNOSIS — E785 Hyperlipidemia, unspecified: Secondary | ICD-10-CM

## 2014-06-23 DIAGNOSIS — M25562 Pain in left knee: Secondary | ICD-10-CM

## 2014-06-23 DIAGNOSIS — M25559 Pain in unspecified hip: Secondary | ICD-10-CM

## 2014-06-23 DIAGNOSIS — I4891 Unspecified atrial fibrillation: Secondary | ICD-10-CM

## 2014-06-23 DIAGNOSIS — G8929 Other chronic pain: Secondary | ICD-10-CM | POA: Insufficient documentation

## 2014-06-23 LAB — NMR, LIPOPROFILE
CHOLESTEROL: 243 mg/dL — AB (ref 100–199)
HDL Cholesterol by NMR: 86 mg/dL (ref >39)
HDL PARTICLE NUMBER: 41.4 umol/L (ref >=30.5)
LDL PARTICLE NUMBER: 1134 nmol/L — AB (ref <1000)
LDL Size: 21.2 nm (ref >20.5)
LDL-C: 134 mg/dL — ABNORMAL HIGH (ref 0–99)
Small LDL Particle Number: 264 nmol/L (ref <=527)
TRIGLYCERIDES BY NMR: 116 mg/dL (ref 0–149)

## 2014-06-23 LAB — HEPATIC FUNCTION PANEL
ALT: 10 IU/L (ref 0–44)
AST: 14 IU/L (ref 0–40)
Albumin: 4.9 g/dL — ABNORMAL HIGH (ref 3.5–4.7)
Alkaline Phosphatase: 67 IU/L (ref 39–117)
Bilirubin Total: 0.4 mg/dL (ref 0.0–1.2)
Bilirubin, Direct: 0.17 mg/dL (ref 0.00–0.40)
Total Protein: 6.6 g/dL (ref 6.0–8.5)

## 2014-06-23 LAB — BMP8+EGFR
BUN / CREAT RATIO: 17 (ref 10–22)
BUN: 20 mg/dL (ref 8–27)
CHLORIDE: 98 mmol/L (ref 97–108)
CO2: 29 mmol/L (ref 18–29)
Calcium: 9.8 mg/dL (ref 8.6–10.2)
Creatinine, Ser: 1.17 mg/dL (ref 0.76–1.27)
GFR calc non Af Amer: 59 mL/min/{1.73_m2} — ABNORMAL LOW (ref >59)
GFR, EST AFRICAN AMERICAN: 68 mL/min/{1.73_m2} (ref >59)
GLUCOSE: 110 mg/dL — AB (ref 65–99)
Potassium: 4.5 mmol/L (ref 3.5–5.2)
SODIUM: 143 mmol/L (ref 134–144)

## 2014-06-23 LAB — PSA, TOTAL AND FREE
PSA, Free Pct: 26.2 %
PSA, Free: 4.27 ng/mL
PSA: 16.3 ng/mL — ABNORMAL HIGH (ref 0.0–4.0)

## 2014-06-23 LAB — VITAMIN D 25 HYDROXY (VIT D DEFICIENCY, FRACTURES): VIT D 25 HYDROXY: 61.6 ng/mL (ref 30.0–100.0)

## 2014-06-23 NOTE — Patient Instructions (Addendum)
Medicare Annual Wellness Visit  [facility] and the medical providers at [facility] strive to bring you the best medical care.  In doing so we not only want to address your current medical conditions and concerns but also to detect new conditions early and prevent illness, disease and health-related problems.    Medicare offers a yearly Wellness Visit which allows our clinical staff to assess your need for preventative services including immunizations, lifestyle education, counseling to decrease risk of preventable diseases and screening for fall risk and other medical concerns.    This visit is provided free of charge (no copay) for all Medicare recipients. The clinical pharmacists at [facility] have begun to conduct these Wellness Visits which will also include a thorough review of all your medications.    As you primary medical provider recommend that you make an appointment for your Annual Wellness Visit if you have not done so already this year.  You may set up this appointment before you leave today or you may call back [phone]) and schedule an appointment.  Please make sure when you call that you mention that you are scheduling your Annual Wellness Visit with the clinical pharmacist so that the appointment may be made for the proper length of time.     Continue current medications. Continue good therapeutic lifestyle changes which include good diet and exercise. Fall precautions discussed with patient. If an FOBT was given today- please return it to our front desk. If you are over 57 years old - you may need Prevnar 45 or the adult Pneumonia vaccine.  Flu Shots are still available at our office. If you still haven't had one please call to set up a nurse visit to get one.   After your visit with [place] today you will receive a survey in the mail or online from [employer] regarding your care with [place]. Please take a moment to  fill this out. Your feedback is very important to [place] as you can help [place] better understand your patient needs as well as improve your experience and satisfaction. WE CARE ABOUT YOU!!!   Stay as active as possible and continue to watch his diet for cholesterol Drink plenty of fluids If you develop any chest pain or tightness please call us and we will arrange for an appointment for you to see the cardiologist If you change your mind regarding the prostate cancer please let us know and we will arrange for further evaluation This winter use good pulmonary hygiene and drink plenty of fluids and keep the house as cool as possible and use a cool mist humidifier. If you have a cough use Mucinex

## 2014-06-23 NOTE — Progress Notes (Signed)
Subjective:    Patient ID: Craig Dean, male    DOB: [birth year], 81 y.o.   MRN: [id number]  HPI Pt here for follow up and management of chronic medical problems which includes hypertension and hyperlipidemia. He is taking medications regularly. These include medications for arthritis which is Tylenol, vitamin D, diltiazem and Vasotec and HCTZ. Lab work will be reviewed with the patient today. His CBC vitamin D liver function tests and creatinine were all good. His blood sugars elevated at 116. More importantly his creatinine is now elevated at 16.3. The patient has seen Dr. Irine Seal and is aware that he has an aggressive prostate cancer and is adamant about not wanting to do anything about this. He feels that doing something about this prostate cancer we'll make him feel worse than he already feels. He also does not want to see the urologist anymore because he is not going to do anything about prostate cancer. He is still followed by the cardiologist and keeps his regular appointments with him because of the atrial fibrillation. He is also statin intolerant. The patient also sees the cardiologist periodically and prefers not to go back and see him unless he is having problems. He is always has shortness of breath because of the removal of one lung back in 1998 for cancer and this has not changed any other than with his aging situation he is not having any chest pain. His bowels are moving well other than just constipation which she has off and on anyway. 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 He currently takes Tylenol arthritis 1 twice daily and this is adequate for controlling his pain.         Patient Active Problem List   Diagnosis Date Noted  . Elevated PSA 02/16/2014  . Prostate cancer 02/16/2014  . H/O pneumonectomy 02/16/2014  . Vitamin D deficiency 04/28/2013  . History of lung cancer 04/28/2013  . History of  cholelithiasis 04/28/2013  . History of prostatectomy 04/28/2013  . AORTIC VALVE DISORDERS 07/14/2009  . EDEMA 07/14/2009  . CHF 06/09/2009  . Hyperlipidemia 05/28/2009  . HTN (hypertension) 05/28/2009  . CAD 05/28/2009  . ATRIAL FIBRILLATION 05/28/2009  . ARTHRITIS 05/28/2009   Outpatient Encounter Prescriptions as of 06/23/2014  Medication Sig  . acetaminophen (TYLENOL ARTHRITIS PAIN) 650 MG CR tablet Take 650 mg by mouth every 8 (eight) hours as needed.    Marland Kitchen aspirin 81 MG tablet Take 81 mg by mouth daily.    . cholecalciferol (VITAMIN D) 1000 UNITS tablet Take 1,000 Units by mouth 2 (two) times daily.    Marland Kitchen diltiazem (TIAZAC) 300 MG 24 hr capsule TAKE 1 CAPSULE (300 MG TOTAL) BY MOUTH DAILY.  Marland Kitchen enalapril (VASOTEC) 20 MG tablet Take 1 tablet (20 mg total) by mouth 2 (two) times daily.  . hydrochlorothiazide (MICROZIDE) 12.5 MG capsule TAKE 1 TABLET (12.5 MG TOTAL) BY MOUTH DAILY.    Review of Systems  Constitutional: Negative.   HENT: Negative.   Eyes: Negative.   Respiratory: Negative.   Cardiovascular: Negative.   Gastrointestinal: Negative.   Endocrine: Negative.   Genitourinary: Negative.   Musculoskeletal: Positive for arthralgias.  Skin: Negative.   Allergic/Immunologic: Negative.   Neurological: Negative.   Hematological: Negative.   Psychiatric/Behavioral: Negative.        Objective:   Physical Exam  Constitutional: He is oriented to person, place, and time. He appears well-developed and well-nourished. No  distress.  HENT:  Head: Normocephalic and atraumatic.  Right Ear: External ear normal.  Left Ear: External ear normal.  Nose: Nose normal.  Mouth/Throat: Oropharynx is clear and moist. No oropharyngeal exudate.  The patient is edentulous  Eyes: Conjunctivae and EOM are normal. Pupils are equal, round, and reactive to light. Right eye exhibits no discharge. Left eye exhibits no discharge. No scleral icterus.  Neck: Normal range of motion. Neck supple. No  thyromegaly present.  The patient has bilateral carotid and supraclavicular bruits  Cardiovascular: Normal rate and intact distal pulses.  Exam reveals no gallop and no friction rub.   Murmur heard. There is a grade 3 to 4/6 systolic ejection murmur The heart is irregular irregular at 72/m  Pulmonary/Chest: Effort normal. No respiratory distress. He has no wheezes. He has no rales. He exhibits no tenderness.  There is no axillary adenopathy The breath sounds are absent in the right chest secondary to pneumonectomy in 1998 from lung cancer  Abdominal: Soft. Bowel sounds are normal. He exhibits no mass. There is no tenderness. There is no rebound and no guarding.  The abdomen is nontender without abdominal bruits or inguinal nodes  Musculoskeletal: Normal range of motion. He exhibits no edema or tenderness.  Lymphadenopathy:    He has no cervical adenopathy.  Neurological: He is alert and oriented to person, place, and time. He has normal reflexes. No cranial nerve deficit.  Skin: Skin is warm and dry. No rash noted. No erythema. No pallor.  Psychiatric: He has a normal mood and affect. His behavior is normal. Judgment and thought content normal.  The patient is adamant that he wants no treatment for his prostate cancer and he does not want to see the cardiologist, as he develops any additional problems with his heart  Nursing note and vitals reviewed.  BP 134/70 mmHg  Pulse 89  Temp(Src) 98 F (36.7 C) (Oral)  Ht 5' 8.5" (1.74 m)  Wt 180 lb (81.647 kg)  BMI 26.97 kg/m2        Assessment & Plan:  1. Hyperlipidemia -The patient is statin intolerant and we'll continue to get as much exercise as possible and watch his diet as closely as possible  2. Atrial fibrillation, unspecified -The patient will continue his baby aspirin daily and his rate is stable in the 70s.  3. Essential hypertension -The blood pressure is well controlled today and he will continue to take the diltiazem and  HCTZ. He will also continue with his Vasotec.  4. Vitamin D deficiency -The vitamin D level was good and he will continue with his current treatment  5. Elevated PSA -The patient prefers no treatment for this  6. Prostate cancer -The patient is aware of his condition and prefers no treatment  7. Chronic arthralgias of knees and hips, unspecified laterality -He will continue with the Tylenol arthritis as he is doing because this is controlling his symptoms well   Patient Instructions                       Medicare Annual Wellness Visit  [facility] and the medical providers at [facility] strive to bring you the best medical care.  In doing so we not only want to address your current medical conditions and concerns but also to detect new conditions early and prevent illness, disease and health-related problems.    Medicare offers a yearly Wellness Visit which allows our clinical staff to assess your need  for preventative services including immunizations, lifestyle education, counseling to decrease risk of preventable diseases and screening for fall risk and other medical concerns.    This visit is provided free of charge (no copay) for all Medicare recipients. The clinical pharmacists at [facility] have begun to conduct these Wellness Visits which will also include a thorough review of all your medications.    As you primary medical provider recommend that you make an appointment for your Annual Wellness Visit if you have not done so already this year.  You may set up this appointment before you leave today or you may call back ([phone]) and schedule an appointment.  Please make sure when you call that you mention that you are scheduling your Annual Wellness Visit with the clinical pharmacist so that the appointment may be made for the proper length of time.     Continue current medications. Continue good therapeutic lifestyle changes which  include good diet and exercise. Fall precautions discussed with patient. If an FOBT was given today- please return it to our front desk. If you are over 47 years old - you may need Prevnar 20 or the adult Pneumonia vaccine.  Flu Shots are still available at our office. If you still haven't had one please call to set up a nurse visit to get one.   After your visit with [place] today you will receive a survey in the mail or online from [employer] regarding your care with [place]. Please take a moment to fill this out. Your feedback is very important to [place] as you can help [place] better understand your patient needs as well as improve your experience and satisfaction. WE CARE ABOUT YOU!!!   Stay as active as possible and continue to watch his diet for cholesterol Drink plenty of fluids If you develop any chest pain or tightness please call us and we will arrange for an appointment for you to see the cardiologist If you change your mind regarding the prostate cancer please let us know and we will arrange for further evaluation This winter use good pulmonary hygiene and drink plenty of fluids and keep the house as cool as possible and use a cool mist humidifier. If you have a cough use Mucinex   Arrie Senate MD

## 2014-07-16 ENCOUNTER — Other Ambulatory Visit: Payer: Self-pay | Admitting: Family Medicine

## 2014-08-28 ENCOUNTER — Other Ambulatory Visit: Payer: Self-pay | Admitting: Family Medicine

## 2014-09-07 ENCOUNTER — Other Ambulatory Visit: Payer: Self-pay | Admitting: Family Medicine

## 2014-10-14 HISTORY — PX: LIVER BIOPSY: SHX301

## 2014-10-26 ENCOUNTER — Other Ambulatory Visit (INDEPENDENT_AMBULATORY_CARE_PROVIDER_SITE_OTHER): Payer: Medicare Other

## 2014-10-26 DIAGNOSIS — E785 Hyperlipidemia, unspecified: Secondary | ICD-10-CM

## 2014-10-26 DIAGNOSIS — I10 Essential (primary) hypertension: Secondary | ICD-10-CM

## 2014-10-26 DIAGNOSIS — E559 Vitamin D deficiency, unspecified: Secondary | ICD-10-CM | POA: Diagnosis not present

## 2014-10-26 DIAGNOSIS — C61 Malignant neoplasm of prostate: Secondary | ICD-10-CM

## 2014-10-26 DIAGNOSIS — I4891 Unspecified atrial fibrillation: Secondary | ICD-10-CM | POA: Diagnosis not present

## 2014-10-26 LAB — POCT CBC
Granulocyte percent: 86.9 % — AB (ref 37–80)
HCT, POC: 45.1 % (ref 43.5–53.7)
Hemoglobin: 14.1 g/dL (ref 14.1–18.1)
Lymph, poc: 0.9 (ref 0.6–3.4)
MCH, POC: 26.3 pg — AB (ref 27–31.2)
MCHC: 31.3 g/dL — AB (ref 31.8–35.4)
MCV: 83.9 fL (ref 80–97)
MPV: 8.4 fL (ref 0–99.8)
POC Granulocyte: 8.4 — AB (ref 2–6.9)
POC LYMPH PERCENT: 9.7 % — AB (ref 10–50)
Platelet Count, POC: 217 K/uL (ref 142–424)
RBC: 5.38 M/uL (ref 4.69–6.13)
RDW, POC: 14.3 %
WBC: 9.7 K/uL (ref 4.6–10.2)

## 2014-10-26 NOTE — Progress Notes (Signed)
Lab only 

## 2014-10-27 ENCOUNTER — Telehealth: Payer: Self-pay | Admitting: Family Medicine

## 2014-10-27 ENCOUNTER — Inpatient Hospital Stay (HOSPITAL_COMMUNITY): Payer: Medicare Other

## 2014-10-27 ENCOUNTER — Ambulatory Visit (INDEPENDENT_AMBULATORY_CARE_PROVIDER_SITE_OTHER): Payer: Medicare Other

## 2014-10-27 ENCOUNTER — Encounter: Payer: Self-pay | Admitting: Family Medicine

## 2014-10-27 ENCOUNTER — Inpatient Hospital Stay (HOSPITAL_COMMUNITY)
Admission: AD | Admit: 2014-10-27 | Discharge: 2014-10-30 | DRG: 436 | Disposition: A | Discharge status: Home / Self Care | Payer: Medicare Other | Source: Ambulatory Visit | Attending: Internal Medicine | Admitting: Internal Medicine

## 2014-10-27 ENCOUNTER — Ambulatory Visit (INDEPENDENT_AMBULATORY_CARE_PROVIDER_SITE_OTHER): Payer: Medicare Other | Admitting: Family Medicine

## 2014-10-27 ENCOUNTER — Encounter (HOSPITAL_COMMUNITY): Payer: Self-pay | Admitting: *Deleted

## 2014-10-27 VITALS — BP 145/87 | HR 55 | Temp 101.6°F | Ht 68.5 in | Wt 168.0 lb

## 2014-10-27 DIAGNOSIS — E559 Vitamin D deficiency, unspecified: Secondary | ICD-10-CM | POA: Diagnosis not present

## 2014-10-27 DIAGNOSIS — R972 Elevated prostate specific antigen [PSA]: Secondary | ICD-10-CM

## 2014-10-27 DIAGNOSIS — C61 Malignant neoplasm of prostate: Secondary | ICD-10-CM | POA: Diagnosis not present

## 2014-10-27 DIAGNOSIS — E440 Moderate protein-calorie malnutrition: Secondary | ICD-10-CM | POA: Diagnosis present

## 2014-10-27 DIAGNOSIS — M129 Arthropathy, unspecified: Secondary | ICD-10-CM | POA: Diagnosis present

## 2014-10-27 DIAGNOSIS — Z8249 Family history of ischemic heart disease and other diseases of the circulatory system: Secondary | ICD-10-CM

## 2014-10-27 DIAGNOSIS — R1900 Intra-abdominal and pelvic swelling, mass and lump, unspecified site: Secondary | ICD-10-CM | POA: Diagnosis present

## 2014-10-27 DIAGNOSIS — E785 Hyperlipidemia, unspecified: Secondary | ICD-10-CM | POA: Diagnosis present

## 2014-10-27 DIAGNOSIS — Z9221 Personal history of antineoplastic chemotherapy: Secondary | ICD-10-CM | POA: Diagnosis not present

## 2014-10-27 DIAGNOSIS — Z85118 Personal history of other malignant neoplasm of bronchus and lung: Secondary | ICD-10-CM

## 2014-10-27 DIAGNOSIS — N179 Acute kidney failure, unspecified: Secondary | ICD-10-CM | POA: Diagnosis present

## 2014-10-27 DIAGNOSIS — Z9889 Other specified postprocedural states: Secondary | ICD-10-CM

## 2014-10-27 DIAGNOSIS — Z820 Family history of epilepsy and other diseases of the nervous system: Secondary | ICD-10-CM | POA: Diagnosis not present

## 2014-10-27 DIAGNOSIS — R109 Unspecified abdominal pain: Secondary | ICD-10-CM | POA: Diagnosis present

## 2014-10-27 DIAGNOSIS — R1084 Generalized abdominal pain: Secondary | ICD-10-CM | POA: Diagnosis not present

## 2014-10-27 DIAGNOSIS — I2510 Atherosclerotic heart disease of native coronary artery without angina pectoris: Secondary | ICD-10-CM | POA: Diagnosis present

## 2014-10-27 DIAGNOSIS — R0609 Other forms of dyspnea: Secondary | ICD-10-CM

## 2014-10-27 DIAGNOSIS — R160 Hepatomegaly, not elsewhere classified: Secondary | ICD-10-CM

## 2014-10-27 DIAGNOSIS — C7951 Secondary malignant neoplasm of bone: Secondary | ICD-10-CM | POA: Diagnosis present

## 2014-10-27 DIAGNOSIS — Z9079 Acquired absence of other genital organ(s): Secondary | ICD-10-CM | POA: Diagnosis present

## 2014-10-27 DIAGNOSIS — I480 Paroxysmal atrial fibrillation: Secondary | ICD-10-CM

## 2014-10-27 DIAGNOSIS — I10 Essential (primary) hypertension: Secondary | ICD-10-CM | POA: Diagnosis present

## 2014-10-27 DIAGNOSIS — D693 Immune thrombocytopenic purpura: Secondary | ICD-10-CM | POA: Diagnosis present

## 2014-10-27 DIAGNOSIS — Z902 Acquired absence of lung [part of]: Secondary | ICD-10-CM | POA: Diagnosis present

## 2014-10-27 DIAGNOSIS — F329 Major depressive disorder, single episode, unspecified: Secondary | ICD-10-CM | POA: Diagnosis present

## 2014-10-27 DIAGNOSIS — I482 Chronic atrial fibrillation: Secondary | ICD-10-CM | POA: Diagnosis present

## 2014-10-27 DIAGNOSIS — N390 Urinary tract infection, site not specified: Secondary | ICD-10-CM | POA: Diagnosis not present

## 2014-10-27 DIAGNOSIS — R0602 Shortness of breath: Secondary | ICD-10-CM | POA: Diagnosis not present

## 2014-10-27 DIAGNOSIS — C787 Secondary malignant neoplasm of liver and intrahepatic bile duct: Secondary | ICD-10-CM | POA: Diagnosis present

## 2014-10-27 DIAGNOSIS — I5022 Chronic systolic (congestive) heart failure: Secondary | ICD-10-CM | POA: Diagnosis present

## 2014-10-27 DIAGNOSIS — I4891 Unspecified atrial fibrillation: Secondary | ICD-10-CM | POA: Diagnosis present

## 2014-10-27 DIAGNOSIS — F419 Anxiety disorder, unspecified: Secondary | ICD-10-CM | POA: Diagnosis present

## 2014-10-27 DIAGNOSIS — Z923 Personal history of irradiation: Secondary | ICD-10-CM | POA: Diagnosis not present

## 2014-10-27 DIAGNOSIS — Z8546 Personal history of malignant neoplasm of prostate: Secondary | ICD-10-CM | POA: Diagnosis not present

## 2014-10-27 DIAGNOSIS — R8281 Pyuria: Secondary | ICD-10-CM

## 2014-10-27 DIAGNOSIS — R0600 Dyspnea, unspecified: Secondary | ICD-10-CM | POA: Diagnosis not present

## 2014-10-27 HISTORY — DX: Malignant neoplasm of prostate: C61

## 2014-10-27 LAB — BMP8+EGFR
BUN/Creatinine Ratio: 24 — ABNORMAL HIGH (ref 10–22)
BUN: 33 mg/dL — ABNORMAL HIGH (ref 8–27)
CALCIUM: 9.5 mg/dL (ref 8.6–10.2)
CO2: 24 mmol/L (ref 18–29)
CREATININE: 1.4 mg/dL — AB (ref 0.76–1.27)
Chloride: 93 mmol/L — ABNORMAL LOW (ref 97–108)
GFR, EST AFRICAN AMERICAN: 54 mL/min/{1.73_m2} — AB (ref >59)
GFR, EST NON AFRICAN AMERICAN: 47 mL/min/{1.73_m2} — AB (ref >59)
Glucose: 122 mg/dL — ABNORMAL HIGH (ref 65–99)
Potassium: 4.5 mmol/L (ref 3.5–5.2)
SODIUM: 137 mmol/L (ref 134–144)

## 2014-10-27 LAB — POCT URINALYSIS DIPSTICK
GLUCOSE UA: NEGATIVE
NITRITE UA: NEGATIVE
PH UA: 6
Spec Grav, UA: 1.025
UROBILINOGEN UA: NEGATIVE

## 2014-10-27 LAB — COMPREHENSIVE METABOLIC PANEL
ALBUMIN: 3.4 g/dL — AB (ref 3.5–5.0)
ALK PHOS: 220 U/L — AB (ref 38–126)
ALT: 31 U/L (ref 17–63)
ANION GAP: 8 (ref 5–15)
AST: 78 U/L — ABNORMAL HIGH (ref 15–41)
BUN: 36 mg/dL — AB (ref 6–20)
CALCIUM: 8.8 mg/dL — AB (ref 8.9–10.3)
CO2: 30 mmol/L (ref 22–32)
Chloride: 95 mmol/L — ABNORMAL LOW (ref 101–111)
Creatinine, Ser: 1.2 mg/dL (ref 0.61–1.24)
GFR calc non Af Amer: 55 mL/min — ABNORMAL LOW (ref >60)
GLUCOSE: 121 mg/dL — AB (ref 65–99)
Potassium: 4.9 mmol/L (ref 3.5–5.1)
Sodium: 133 mmol/L — ABNORMAL LOW (ref 135–145)
TOTAL PROTEIN: 6.3 g/dL — AB (ref 6.5–8.1)
Total Bilirubin: 2.1 mg/dL — ABNORMAL HIGH (ref 0.3–1.2)

## 2014-10-27 LAB — CBC
HEMATOCRIT: 41.2 % (ref 39.0–52.0)
Hemoglobin: 13.5 g/dL (ref 13.0–17.0)
MCH: 27.8 pg (ref 26.0–34.0)
MCHC: 32.8 g/dL (ref 30.0–36.0)
MCV: 84.8 fL (ref 78.0–100.0)
PLATELETS: 142 10*3/uL — AB (ref 150–400)
RBC: 4.86 MIL/uL (ref 4.22–5.81)
RDW: 13.8 % (ref 11.5–15.5)
WBC: 9 10*3/uL (ref 4.0–10.5)

## 2014-10-27 LAB — POCT UA - MICROSCOPIC ONLY
BACTERIA, U MICROSCOPIC: NEGATIVE
CASTS, UR, LPF, POC: NEGATIVE
Crystals, Ur, HPF, POC: NEGATIVE
Yeast, UA: NEGATIVE

## 2014-10-27 LAB — NMR, LIPOPROFILE
Cholesterol: 199 mg/dL (ref 100–199)
HDL Cholesterol by NMR: 37 mg/dL — ABNORMAL LOW (ref >39)
HDL Particle Number: 29.7 umol/L — ABNORMAL LOW (ref >=30.5)
LDL Particle Number: 2080 nmol/L — ABNORMAL HIGH (ref <1000)
LDL Size: 20.8 nm (ref >20.5)
LDL-C: 115 mg/dL — ABNORMAL HIGH (ref 0–99)
LP-IR Score: 46 — ABNORMAL HIGH (ref <=45)
Small LDL Particle Number: 1175 nmol/L — ABNORMAL HIGH (ref <=527)
Triglycerides by NMR: 233 mg/dL — ABNORMAL HIGH (ref 0–149)

## 2014-10-27 LAB — HEPATIC FUNCTION PANEL
ALT: 37 IU/L (ref 0–44)
AST: 93 IU/L — ABNORMAL HIGH (ref 0–40)
Albumin: 4 g/dL (ref 3.5–4.7)
Alkaline Phosphatase: 274 IU/L — ABNORMAL HIGH (ref 39–117)
BILIRUBIN, DIRECT: 0.9 mg/dL — AB (ref 0.00–0.40)
Bilirubin Total: 1.3 mg/dL — ABNORMAL HIGH (ref 0.0–1.2)
Total Protein: 6.4 g/dL (ref 6.0–8.5)

## 2014-10-27 LAB — TROPONIN I
TROPONIN I: 0.05 ng/mL — AB (ref <0.031)
Troponin I: 0.05 ng/mL — ABNORMAL HIGH (ref <0.031)

## 2014-10-27 LAB — TSH: TSH: 0.69 u[IU]/mL (ref 0.350–4.500)

## 2014-10-27 LAB — VITAMIN D 25 HYDROXY (VIT D DEFICIENCY, FRACTURES): Vit D, 25-Hydroxy: 52 ng/mL (ref 30.0–100.0)

## 2014-10-27 MED ORDER — ALPRAZOLAM 0.25 MG PO TABS
0.2500 mg | ORAL_TABLET | Freq: Three times a day (TID) | ORAL | Status: DC | PRN
  Administered 2014-10-27: 0.25 mg via ORAL
  Filled 2014-10-27: qty 1

## 2014-10-27 MED ORDER — DILTIAZEM HCL ER COATED BEADS 180 MG PO CP24
300.0000 mg | ORAL_CAPSULE | Freq: Every day | ORAL | Status: DC
  Administered 2014-10-27 – 2014-10-28 (×2): 300 mg via ORAL
  Filled 2014-10-27 (×4): qty 1

## 2014-10-27 MED ORDER — HYDROMORPHONE HCL 1 MG/ML IJ SOLN: 0.5000 mg | INTRAMUSCULAR | Status: DC | PRN

## 2014-10-27 MED ORDER — IOHEXOL 300 MG/ML  SOLN
100.0000 mL | Freq: Once | INTRAMUSCULAR | Status: AC | PRN
  Administered 2014-10-27: 100 mL via INTRAVENOUS

## 2014-10-27 MED ORDER — ENOXAPARIN SODIUM 40 MG/0.4ML ~~LOC~~ SOLN
40.0000 mg | SUBCUTANEOUS | Status: DC
  Administered 2014-10-27 – 2014-10-30 (×2): 40 mg via SUBCUTANEOUS
  Filled 2014-10-27 (×2): qty 0.4

## 2014-10-27 MED ORDER — FLUOXETINE HCL 10 MG PO CAPS
10.0000 mg | ORAL_CAPSULE | Freq: Every day | ORAL | Status: DC
  Administered 2014-10-27 – 2014-10-30 (×3): 10 mg via ORAL
  Filled 2014-10-27 (×8): qty 1

## 2014-10-27 MED ORDER — SODIUM CHLORIDE 0.9 % IV BOLUS (SEPSIS)
250.0000 mL | Freq: Once | INTRAVENOUS | Status: AC
  Administered 2014-10-27: 250 mL via INTRAVENOUS

## 2014-10-27 MED ORDER — ACETAMINOPHEN 325 MG PO TABS
650.0000 mg | ORAL_TABLET | Freq: Three times a day (TID) | ORAL | Status: DC
  Administered 2014-10-27 – 2014-10-30 (×9): 650 mg via ORAL
  Filled 2014-10-27 (×9): qty 2

## 2014-10-27 MED ORDER — IOHEXOL 300 MG/ML  SOLN
50.0000 mL | Freq: Once | INTRAMUSCULAR | Status: AC | PRN
  Administered 2014-10-27: 50 mL via ORAL

## 2014-10-27 MED ORDER — DEXTROSE-NACL 5-0.9 % IV SOLN
INTRAVENOUS | Status: DC
  Administered 2014-10-27 – 2014-10-29 (×3): via INTRAVENOUS

## 2014-10-27 MED ORDER — SODIUM CHLORIDE 0.9 % IJ SOLN
3.0000 mL | Freq: Two times a day (BID) | INTRAMUSCULAR | Status: DC
  Administered 2014-10-28 – 2014-10-30 (×3): 3 mL via INTRAVENOUS

## 2014-10-27 MED ORDER — ASPIRIN 81 MG PO CHEW
81.0000 mg | CHEWABLE_TABLET | Freq: Every day | ORAL | Status: DC
  Administered 2014-10-27 – 2014-10-30 (×3): 81 mg via ORAL
  Filled 2014-10-27 (×3): qty 1

## 2014-10-27 NOTE — Patient Instructions (Addendum)
Medicare Annual Wellness Visit  [facility] and the medical providers at [facility] strive to bring you the best medical care.  In doing so we not only want to address your current medical conditions and concerns but also to detect new conditions early and prevent illness, disease and health-related problems.    Medicare offers a yearly Wellness Visit which allows our clinical staff to assess your need for preventative services including immunizations, lifestyle education, counseling to decrease risk of preventable diseases and screening for fall risk and other medical concerns.    This visit is provided free of charge (no copay) for all Medicare recipients. The clinical pharmacists at [facility] have begun to conduct these Wellness Visits which will also include a thorough review of all your medications.    As you primary medical provider recommend that you make an appointment for your Annual Wellness Visit if you have not done so already this year.  You may set up this appointment before you leave today or you may call back ([phone]) and schedule an appointment.  Please make sure when you call that you mention that you are scheduling your Annual Wellness Visit with the clinical pharmacist so that the appointment may be made for the proper length of time.     Continue current medications. Continue good therapeutic lifestyle changes which include good diet and exercise. Fall precautions discussed with patient. If an FOBT was given today- please return it to our front desk. If you are over 54 years old - you may need Prevnar 78 or the adult Pneumonia vaccine.  Flu Shots are still available at our office. If you still haven't had one please call to set up a nurse visit to get one.   After your visit with [place] today you will receive a survey in the mail or online from [employer] regarding your care with [place]. Please take a moment to  fill this out. Your feedback is very important to [place] as you can help [place] better understand your patient needs as well as improve your experience and satisfaction. WE CARE ABOUT YOU!!!   The patient will be admitted to the hospitalist service because of the enlarged liver liver function tests fever and malaise

## 2014-10-27 NOTE — H&P (Signed)
[facility] History and Physical  Craig Dean WJX:[id number] DOB: [birth year]    PCP:   Redge Gainer, MD   Chief Complaint: Direct admit at the request of Dr Laurance Flatten for further work up of possible hepatomegaly and abdominal pain.  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  He has baseline SOB, worsen with his anxiety, and having depression.   At the office, he was noted to have low grade fever, and CXR done there did not show any infiltrate.  He has a slight elevated Cr of 1.4.  He has not been feeling well, and has lost weight.  Hospitalist was asked to admit him for further work up.  He had CBC done yesterday, and had no leukocytosis, Hb of 14.1 g per dL, and his UA only showed a few WBCs.     Rewiew of Systems:  Constitutional: Negative for malaise, fever and chills. No significant weight loss or weight gain Eyes: Negative for eye pain, redness and discharge, diplopia, visual changes, or flashes of light. ENMT: Negative for ear pain, hoarseness, nasal congestion, sinus pressure and sore throat. No headaches; tinnitus, drooling, or problem swallowing. Cardiovascular: Negative for chest pain, palpitations, diaphoresis, dyspnea and peripheral edema. ; No orthopnea, PND Respiratory: Negative for cough, hemoptysis, wheezing and stridor. No pleuritic chestpain. Gastrointestinal: Negative for nausea, vomiting, diarrhea, constipation, melena, blood in stool, hematemesis, jaundice and rectal bleeding.    Genitourinary: Negative for frequency, dysuria, incontinence,flank pain and hematuria; Musculoskeletal: Negative for back pain and  neck pain. Negative for swelling and trauma.;  Skin: . Negative for pruritus, rash, abrasions, bruising and skin lesion.; ulcerations Neuro: Negative for headache, lightheadedness and neck stiffness. Negative for weakness, altered level of consciousness , altered mental status, extremity weakness, burning feet, involuntary movement, seizure and syncope.  Psych: negative for anxiety, depression, insomnia, tearfulness, panic attacks, hallucinations, paranoia, suicidal or homicidal ideation    Past Medical History  Diagnosis Date  . HYPERLIPIDEMIA   . HYPERTENSION   . CAD   . Aortic valve disorders   . Atrial fibrillation   . CHF   . Edema   . Lung cancer   . ITP (idiopathic thrombocytopenic purpura)     Past Surgical History  Procedure Laterality Date  . Carpal tunnel release    . Pneumonectomy    . Prostatectomy      Medications:  HOME MEDS: Prior to Admission medications   Medication Sig Start Date End Date Taking? Authorizing Provider  acetaminophen (TYLENOL ARTHRITIS PAIN) 650 MG CR tablet Take 650 mg by mouth every 8 (eight) hours as needed.      Historical Provider, MD  aspirin 81 MG tablet Take 81 mg by mouth daily.      Historical Provider, MD  diltiazem (CARDIZEM CD) 300 MG 24 hr capsule Take 300 mg by mouth daily. 09/07/14   Historical Provider, MD  diltiazem (TIAZAC) 300 MG 24 hr capsule TAKE 1 CAPSULE (300 MG TOTAL) BY MOUTH DAILY. 09/07/14   DChipper Herb MD  enalapril (VASOTEC) 20 MG tablet TAKE 1 TABLET (20 MG TOTAL) BY MOUTH 2 (TWO) TIMES DAILY. 07/16/14  Chipper Herb, MD  hydrochlorothiazide (MICROZIDE) 12.5 MG capsule TAKE 1 TABLET (12.5 MG TOTAL) BY MOUTH DAILY. 08/28/14   Chipper Herb, MD     Allergies:  Allergies  Allergen Reactions  . Penicillins   . Statins Other (See Comments)    myalgias    Social History:   reports that he quit smoking about 32 years ago. His smoking use included Cigarettes. He does not have any smokeless tobacco history on  file. He reports that he does not drink alcohol or use illicit drugs.  Family History: Family History  Problem Relation Age of Onset  . Alzheimer's disease Father      Physical Exam: Filed Vitals:   10/27/14 1426  BP: 130/79  Pulse: 97  Temp: 100.4 F (38 C)  TempSrc: Oral  Resp: 18  Height: '5\' 10"'$  (1.778 m)  Weight: 74.934 kg (165 lb 3.2 oz)  SpO2: 99%   Blood pressure 130/79, pulse 97, temperature 100.4 F (38 C), temperature source Oral, resp. rate 18, height '5\' 10"'$  (1.778 m), weight 74.934 kg (165 lb 3.2 oz), SpO2 99 %.  GEN:  patient lying in the stretcher in no acute distress; cooperative with exam. PSYCH:  alert and oriented x4; does  appear anxious or depressed; affect is appropriate. HEENT: Mucous membranes pink and anicteric; PERRLA; EOM intact; no cervical lymphadenopathy nor thyromegaly or carotid bruit; no JVD; There were no stridor. Neck is very supple. Breasts:: Not examined CHEST WALL: No tenderness CHEST: Normal respiration, clear to auscultation bilaterally.  HEART: Regular rate and rhythm.  There are no murmur, rub, or gallops.   BACK: No kyphosis or scoliosis; no CVA tenderness ABDOMEN: soft and tender in the epigastrium.  There is a mass, about 4cm over the epigastric area as well.  no organomegaly, normal abdominal bowel sounds; no pannus; no intertriginous candida. There is no rebound and no distention. Rectal Exam: Not done EXTREMITIES: No bone or joint deformity; age-appropriate arthropathy of the hands and knees; no edema; no ulcerations.  There is no calf tenderness. Genitalia: not examined PULSES: 2+ and symmetric SKIN: Normal hydration no rash or ulceration CNS: Cranial nerves 2-12 grossly intact no focal lateralizing neurologic deficit.  Speech is fluent; uvula elevated with phonation, facial symmetry and tongue midline. DTR are normal bilaterally, cerebella exam is intact, barbinski is negative and strengths are equaled bilaterally.  No sensory  loss.   Labs on Admission:  Basic Metabolic Panel:  Recent Labs Lab 10/26/14 1624  NA 137  K 4.5  CL 93*  CO2 24  GLUCOSE 122*  BUN 33*  CREATININE 1.40*  CALCIUM 9.5   Liver Function Tests:  Recent Labs Lab 10/26/14 1624  AST 93*  ALT 37  ALKPHOS 274*  BILITOT 1.3*  PROT 6.4   No results for input(s): LIPASE, AMYLASE in the last 168 hours. No results for input(s): AMMONIA in the last 168 hours. CBC:  Recent Labs Lab 10/26/14 1626  WBC 9.7  HGB 14.1  HCT 45.1  MCV 83.9   Cardiac Enzymes: No results for input(s): CKTOTAL, CKMB, CKMBINDEX, TROPONINI in the last 168 hours.  CBG: No results for input(s): GLUCAP in the last 168 hours.   Radiological Exams on Admission: Dg Chest 2 View  10/27/2014   CLINICAL DATA:  Short of breath, fever, history of prior right pneumonectomy  EXAM: CHEST  2 VIEW  COMPARISON:  Chest x-ray of 02/16/2014  FINDINGS: Opacification of the entire right hemi thorax is due to prior right  pneumonectomy with fibrothorax. Mediastinal shift to the right is noted. The left lung is clear. Heart size is difficult to assess. There are degenerative changes throughout the thoracic spine.  IMPRESSION: Stable changes of right pneumonectomy.  The left lung is clear.   Electronically Signed   By: Ivar Drape M.D.   On: 10/27/2014 11:21   Dg Abd 1 View  10/27/2014   CLINICAL DATA:  Abdominal pain, fever, history of right pneumonectomy  EXAM: ABDOMEN - 1 VIEW  COMPARISON:  CT abdomen pelvis of 12/26/2013 and abdomen plain films of 09/03/2005  FINDINGS: The supine film of the abdomen shows no bowel obstruction. No opaque calculi are seen. 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 There are degenerative changes within the lumbar spine.  IMPRESSION: No bowel obstruction. No opaque calculi. Prior right pneumonectomy.   Electronically Signed   By: Ivar Drape M.D.   On: 10/27/2014 11:20    Assessment/Plan Present on Admission:  . Abdominal swelling, mass, or lump . Arthropathy . Coronary atherosclerosis . Prostate cancer . HTN (hypertension) . Atrial fibrillation . Abdominal pain  PLAN:    Abdominal mass:  Will obtain abdominal pelvic CT with contrast.  We will proceed from there to better evaluate his palpable mass in the epigastric area.  DOE:  Will obtain ECHO.  I will also get a V/Q scan given history of lung ca and prostate cancer.  V/Q is better than CTPA for chronic PE.  Obtain EKG and consult cardiology as he may need a nuclear stress test.   Depression/Anxiety:  I think this has contributed to his feeling fatigue, and SOB as well.  Will start him on low dose Prozac along with low dose Xanax.   Check TSH.  AKI:  Will give gentle IVF.  He likely be able to tolerate CT with contrast for his abd/pelvic CT.   Afib:  Rate is good.   Will continue with his Cardiazem and ASA.  ECHO ordered.   Other plans as per orders.  Code Status: FULL Haskel Khan, MD. [facility] Pager [phone] 7pm to 7am.  10/27/2014, 4:32 PM

## 2014-10-27 NOTE — Telephone Encounter (Signed)
[place]

## 2014-10-27 NOTE — Progress Notes (Signed)
BP 88/40 Mid-level notified and orders given for 250cc bolus IV.

## 2014-10-27 NOTE — Progress Notes (Signed)
Subjective:    Patient ID: Craig Dean, male    DOB: [birth year], 81 y.o.   MRN: [id number]  HPI Pt here for follow up and management of chronic medical problems which includes hypertension, hyperlipidemia and prostate cancer. He is taking medications regularly. He is having a lot of new issues today and is feeling pretty bad. He has had shortness of breath for one month. The patient has a history of an increased PSA and did not want to go back to see the urologist on this visit. Recent lab work was reviewed with the patient this morning they're for liver function tests that were elevated. The creatinine was elevated since the last visit. He also has cholesterol numbers that are extremely elevated. The patient is somewhat tachypnea 8 and short of breath and feeling bad. He says this is been coming on over about 4 weeks. He is aware that he could potentially be having problems with his aggressive prostate cancer and also complains of right upper quadrant pain and tenderness. He is voiding as usual and sometimes has to go more often than usual.      Patient Active Problem List   Diagnosis Date Noted  . Chronic arthralgias of knees and hips 06/23/2014  . Elevated PSA 02/16/2014  . Prostate cancer 02/16/2014  . H/O pneumonectomy 02/16/2014  . Vitamin D deficiency 04/28/2013  . History of lung cancer 04/28/2013  . History of cholelithiasis 04/28/2013  . History of prostatectomy 04/28/2013  . AORTIC VALVE DISORDERS 07/14/2009  . EDEMA 07/14/2009  . CHF 06/09/2009  . Hyperlipidemia 05/28/2009  . HTN (hypertension) 05/28/2009  . CAD 05/28/2009  . ATRIAL FIBRILLATION 05/28/2009  . ARTHRITIS 05/28/2009   Outpatient Encounter Prescriptions as of 10/27/2014  Medication Sig  . acetaminophen (TYLENOL ARTHRITIS PAIN) 650 MG CR tablet Take 650 mg by mouth every 8 (eight) hours as needed.    Marland Kitchen aspirin 81 MG tablet Take 81 mg by mouth daily.    Marland Kitchen diltiazem (TIAZAC) 300 MG 24 hr capsule TAKE 1  CAPSULE (300 MG TOTAL) BY MOUTH DAILY.  Marland Kitchen enalapril (VASOTEC) 20 MG tablet TAKE 1 TABLET (20 MG TOTAL) BY MOUTH 2 (TWO) TIMES DAILY.  . hydrochlorothiazide (MICROZIDE) 12.5 MG capsule TAKE 1 TABLET (12.5 MG TOTAL) BY MOUTH DAILY.  . [DISCONTINUED] cholecalciferol (VITAMIN D) 1000 UNITS tablet Take 1,000 Units by mouth 2 (two) times daily.     No facility-administered encounter medications on file as of 10/27/2014.     Review of Systems  Constitutional: Positive for fever, appetite change (decreased) and fatigue.  HENT: Negative.   Eyes: Negative.   Respiratory: Positive for shortness of breath.   Cardiovascular: Negative.   Gastrointestinal: Positive for abdominal pain.  Endocrine: Negative.        Night sweats  Genitourinary: Negative.   Musculoskeletal: Positive for back pain.  Skin: Negative.   Allergic/Immunologic: Negative.   Neurological: Positive for weakness.  Hematological: Negative.   Psychiatric/Behavioral: The patient is nervous/anxious.        Objective:   Physical Exam  Constitutional: He is oriented to person, place, and time. He appears well-developed and well-nourished. He appears distressed.  HENT:  Head: Normocephalic and atraumatic.  Right Ear: External ear normal.  Left Ear: External ear normal.  Nose: Nose normal.  Mouth/Throat: Oropharynx is clear and moist. No oropharyngeal exudate.  Eyes: Conjunctivae and EOM are normal. Pupils are equal, round, and reactive to light. Right eye exhibits no discharge. Left eye exhibits no discharge. No scleral  icterus.  Neck: Normal range of motion. Neck supple. No thyromegaly present.  Cardiovascular: Normal rate and regular rhythm.  Exam reveals no gallop and no friction rub.   Murmur heard. At about 60/m  Pulmonary/Chest: Breath sounds normal. He is in respiratory distress. He has no wheezes. He has no rales. He exhibits no tenderness.  Diminished breath sounds on the right side and apparently normal breath sounds  on the left side compared to the past.  Abdominal: Soft. Bowel sounds are normal. He exhibits no mass. There is no tenderness. There is no rebound and no guarding.  The liver is enlarged and this has not been the case in the past. It is tender. There is definite right upper quadrant tenderness and enlargement with the liver. The remainder of the abdomen is somewhat tender especially in the. Umbilical area.  Musculoskeletal: He exhibits no edema or tenderness.  Patient is somewhat weak in his movements today.  Lymphadenopathy:    He has no cervical adenopathy.  Neurological: He is alert and oriented to person, place, and time.  Skin: Skin is warm and dry. No rash noted. No erythema. No pallor.  Psychiatric: He has a normal mood and affect. His behavior is normal. Judgment and thought content normal.  He is alert and he is aware that with the elevated liver function tests that this could be a problem with the prostate cancer spreading.  Nursing note and vitals reviewed.   BP 145/87 mmHg  Pulse 55  Temp(Src) 101.6 F (38.7 C)  Ht 5' 8.5" (1.74 m)  Wt 168 lb (76.204 kg)  BMI 25.17 kg/m2  SpO2 96%  Results for orders placed or performed in visit on 10/27/14  POCT UA - Microscopic Only  Result Value Ref Range   WBC, Ur, HPF, POC 5-10    RBC, urine, microscopic 10-12    Bacteria, U Microscopic neg    Mucus, UA occ    Epithelial cells, urine per micros occ    Crystals, Ur, HPF, POC neg    Casts, Ur, LPF, POC neg    Yeast, UA neg   POCT urinalysis dipstick  Result Value Ref Range   Color, UA gold    Clarity, UA clear    Glucose, UA neg    Bilirubin, UA moderate    Ketones, UA moderate    Spec Grav, UA 1.025    Blood, UA moderate    pH, UA 6.0    Protein, UA 30+    Urobilinogen, UA negative    Nitrite, UA neg    Leukocytes, UA Trace (A) Negative   [facility] reading (PRIMARY) by  Dr.Ruqayya Ventress-chest x-ray does not appear to have any problems apparent in the left lung., The KUB indicates a  enlarged liver with intestinal contents being pushed below. Radiological interpretation is pending.                                        Assessment & Plan:  1. Hyperlipidemia -Cholesterol numbers are elevated  2. Atrial fibrillation, unspecified -The heart is irregular at about 60/m  3. Essential hypertension -Blood pressure slightly elevated today at 145/87 and since we are arranging admission and he is under stress right now with feeling bad nothing will be done in regard to this at this time.  4. Vitamin D deficiency -M.D. level was good but we will ask the patient to stop this in  light of his elevated creatinine  5. Elevated PSA -A PSA test will be added to the blood work that was done recently. The patient has seen the urologist in the past and at the last visit said he did not want to do anything about the elevated PSA.  6. Prostate cancer -As noted above - DG Chest 2 View; Future - DG Abd 1 View; Future  7. Hx of cancer of lung -The patient has been doing well with regard to this as this cancer was resected 18 years ago. - DG Chest 2 View; Future - DG Abd 1 View; Future  8. Generalized abdominal pain -This is been increasing recently. - POCT UA - Microscopic Only - POCT urinalysis dipstick - Urine culture - DG Chest 2 View; Future - DG Abd 1 View; Future  9. Enlarged liver -This was first noted today with tenderness in the area of the liver.  10. Shortness of breath -Chest x-ray done today does not reveal any pneumonia that is apparent to me on the x-ray. He does not have a right lung because this was resected 18 years ago.  11. Pyuria -Patient is having minimal symptoms with this.  Patient Instructions                       Medicare Annual Wellness Visit  [facility] and the medical providers at [facility] strive to bring you the best medical care.  In doing so we not only want to address your current medical conditions and  concerns but also to detect new conditions early and prevent illness, disease and health-related problems.    Medicare offers a yearly Wellness Visit which allows our clinical staff to assess your need for preventative services including immunizations, lifestyle education, counseling to decrease risk of preventable diseases and screening for fall risk and other medical concerns.    This visit is provided free of charge (no copay) for all Medicare recipients. The clinical pharmacists at [facility] have begun to conduct these Wellness Visits which will also include a thorough review of all your medications.    As you primary medical provider recommend that you make an appointment for your Annual Wellness Visit if you have not done so already this year.  You may set up this appointment before you leave today or you may call back ([phone]) and schedule an appointment.  Please make sure when you call that you mention that you are scheduling your Annual Wellness Visit with the clinical pharmacist so that the appointment may be made for the proper length of time.     Continue current medications. Continue good therapeutic lifestyle changes which include good diet and exercise. Fall precautions discussed with patient. If an FOBT was given today- please return it to our front desk. If you are over 37 years old - you may need Prevnar 42 or the adult Pneumonia vaccine.  Flu Shots are still available at our office. If you still haven't had one please call to set up a nurse visit to get one.   After your visit with [place] today you will receive a survey in the mail or online from [employer] regarding your care with [place]. Please take a moment to fill this out. Your feedback is very important to [place] as you can help [place] better understand your patient needs as well as improve your experience and satisfaction. WE CARE ABOUT YOU!!!   The patient will be admitted  to the hospitalist service because  of the enlarged liver liver function tests fever and malaise   Arrie Senate MD

## 2014-10-28 ENCOUNTER — Encounter (HOSPITAL_COMMUNITY): Payer: Self-pay

## 2014-10-28 ENCOUNTER — Inpatient Hospital Stay (HOSPITAL_COMMUNITY): Payer: Medicare Other

## 2014-10-28 DIAGNOSIS — R0600 Dyspnea, unspecified: Secondary | ICD-10-CM

## 2014-10-28 DIAGNOSIS — R1900 Intra-abdominal and pelvic swelling, mass and lump, unspecified site: Secondary | ICD-10-CM

## 2014-10-28 DIAGNOSIS — E440 Moderate protein-calorie malnutrition: Secondary | ICD-10-CM | POA: Diagnosis present

## 2014-10-28 DIAGNOSIS — I5022 Chronic systolic (congestive) heart failure: Secondary | ICD-10-CM

## 2014-10-28 DIAGNOSIS — I482 Chronic atrial fibrillation: Secondary | ICD-10-CM

## 2014-10-28 DIAGNOSIS — C801 Malignant (primary) neoplasm, unspecified: Secondary | ICD-10-CM

## 2014-10-28 DIAGNOSIS — C787 Secondary malignant neoplasm of liver and intrahepatic bile duct: Secondary | ICD-10-CM | POA: Diagnosis present

## 2014-10-28 LAB — BASIC METABOLIC PANEL
ANION GAP: 11 (ref 5–15)
BUN: 34 mg/dL — ABNORMAL HIGH (ref 6–20)
CO2: 28 mmol/L (ref 22–32)
Calcium: 8.5 mg/dL — ABNORMAL LOW (ref 8.9–10.3)
Chloride: 93 mmol/L — ABNORMAL LOW (ref 101–111)
Creatinine, Ser: 0.93 mg/dL (ref 0.61–1.24)
GFR calc Af Amer: >60 mL/min (ref >60)
Glucose, Bld: 115 mg/dL — ABNORMAL HIGH (ref 65–99)
Potassium: 3.9 mmol/L (ref 3.5–5.1)
SODIUM: 132 mmol/L — AB (ref 135–145)

## 2014-10-28 LAB — CBC
HCT: 41.1 % (ref 39.0–52.0)
HEMOGLOBIN: 13.4 g/dL (ref 13.0–17.0)
MCH: 27.5 pg (ref 26.0–34.0)
MCHC: 32.6 g/dL (ref 30.0–36.0)
MCV: 84.4 fL (ref 78.0–100.0)
PLATELETS: 138 10*3/uL — AB (ref 150–400)
RBC: 4.87 MIL/uL (ref 4.22–5.81)
RDW: 14 % (ref 11.5–15.5)
WBC: 10.7 10*3/uL — ABNORMAL HIGH (ref 4.0–10.5)

## 2014-10-28 LAB — PSA: PSA: 51.59 ng/mL — ABNORMAL HIGH (ref 0.00–4.00)

## 2014-10-28 LAB — TROPONIN I: Troponin I: 0.04 ng/mL — ABNORMAL HIGH (ref <0.031)

## 2014-10-28 LAB — PROTIME-INR
INR: 1.33 (ref 0.00–1.49)
PROTHROMBIN TIME: 16.6 s — AB (ref 11.6–15.2)

## 2014-10-28 LAB — URINE CULTURE

## 2014-10-28 MED ORDER — TECHNETIUM TO 99M ALBUMIN AGGREGATED
6.0000 | Freq: Once | INTRAVENOUS | Status: AC | PRN
  Administered 2014-10-28: 6.2 via INTRAVENOUS

## 2014-10-28 MED ORDER — ENSURE ENLIVE PO LIQD
237.0000 mL | Freq: Two times a day (BID) | ORAL | Status: DC
  Administered 2014-10-28 – 2014-10-30 (×2): 237 mL via ORAL

## 2014-10-28 MED ORDER — TECHNETIUM TC 99M DIETHYLENETRIAME-PENTAACETIC ACID
40.0000 | Freq: Once | INTRAVENOUS | Status: AC | PRN
  Administered 2014-10-28: 42 via INTRAVENOUS

## 2014-10-28 MED ORDER — SODIUM CHLORIDE 0.9 % IJ SOLN
INTRAMUSCULAR | Status: AC
  Administered 2014-10-28: 3 mL
  Filled 2014-10-28: qty 36

## 2014-10-28 NOTE — Progress Notes (Signed)
Per MD. Pt is not to be given lovenox. Please hold doses until biopsy is completed.

## 2014-10-28 NOTE — Consult Note (Signed)
Inpatient Hematology/Oncology Consultation   Name: Craig Dean      MRN: [id number]    Location: A331/A331-01  Date: 10/28/2014 Time:1:49 PM   REFERRING PHYSICIAN:  Dr. Roderic Palau   REASON FOR CONSULT:  Liver and Bone Metastases    DIAGNOSIS: Liver and Bone Metastases High grade prostate cancer, (per patient Gleason Score 9 in all prostate biopsy specimens)  HISTORY OF PRESENT ILLNESS:   Craig Dean is an 81 y.o. 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 He was admitted for further evaluation. CT of the abdomen on 6/14 showed extensive sclerotic osseous metastatic disease, extensive hepatic metastatic disease. He report increasing fatigue, and having increased DOE for the past 4 months without any chest pain.  He has a history of TURP with Dr. Jeffie Pollock at that time he had no evidence of prostate cancer.  Last September however, he underwent prostate biopsy that was positive for adenocarcinoma. Patient reports being told "it was aggressive" and "his score was a 9"  Bone scan at that time was WNL. Patient opted to not pursue therapy. .   He has a history of lung cancer 18 years ago treated with surgery, XRT and chemotherapy. Dr. Sonny Dandy was his treating oncologist.   PAST MEDICAL HISTORY:   Past Medical History  Diagnosis Date  . HYPERLIPIDEMIA   . HYPERTENSION   . CAD   . Aortic valve disorders   . Atrial fibrillation   . CHF   . Edema   . ITP (idiopathic thrombocytopenic purpura)   . Lung cancer   . Prostate cancer    Surgery with Dr. Arlyce Dice,  Dr. Sonny Dandy Oncologist, Dr. Celine Ahr Radiation in 1998 History of thrombocytopenia, treated by Dr. Jacquiline Doe    ALLERGIES: Allergies  Allergen Reactions  . Penicillins   . Statins Other (See Comments)    myalgias      MEDICATIONS: I  have reviewed the patient's current medications.     PAST SURGICAL HISTORY Past Surgical History  Procedure Laterality Date  . Carpal tunnel release    . Pneumonectomy    . Prostatectomy      FAMILY HISTORY: Family History  Problem Relation Age of Onset  . Alzheimer's disease Father     SOCIAL HISTORY:  reports that he quit smoking about 32 years ago. His smoking use included Cigarettes. He does not have any smokeless tobacco history on file. He reports that he does not drink alcohol or use illicit drugs. Planted tobacco, had cows & goats. 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 Quit smoking in 1982. Started smoking @ age 67. No alcohol. 2 children (boys). 12 grandchildren and great grandchildren. Mother (obese) died in her sleep @ age 73. Father died of CHF and a broken hip @ age 78. No brothers or sisters.  PERFORMANCE STATUS: The patient's performance status is 1 - Symptomatic but completely ambulatory  Review of Systems  Constitutional: Positive for weight loss and malaise/fatigue.  HENT: Negative.   Eyes: Negative.   Respiratory: Positive for shortness of breath. Negative for cough, hemoptysis, sputum production and wheezing.   Cardiovascular: Negative.   Gastrointestinal: Positive for abdominal pain. Negative for heartburn, nausea, vomiting, diarrhea, constipation, blood in stool and melena.  Genitourinary: Negative.   Musculoskeletal:  Positive for joint pain. Negative for myalgias, back pain, falls and neck pain.  Skin: Negative.   Neurological: Negative.  Negative for dizziness.  Endo/Heme/Allergies: Negative.   Psychiatric/Behavioral: Positive for depression. The patient is nervous/anxious.   14 point review of systems was performed and is negative except as detailed under history of present illness and above   PHYSICAL EXAMINATION  ECOG PERFORMANCE STATUS: 1 - Symptomatic but completely ambulatory  Filed Vitals:   10/28/14 1120  BP: 106/44    Pulse:   Temp:   Resp:     Physical Exam  Constitutional: He is oriented to person, place, and time and well-developed, well-nourished, and in no distress.  HENT:  Head: Normocephalic and atraumatic.  Nose: Nose normal.  Mouth/Throat: Oropharynx is clear and moist. No oropharyngeal exudate.  Eyes: Conjunctivae and EOM are normal. Pupils are equal, round, and reactive to light. Right eye exhibits no discharge. Left eye exhibits no discharge. No scleral icterus.  Neck: Normal range of motion. Neck supple. No tracheal deviation present. No thyromegaly present.  Cardiovascular: Normal rate, regular rhythm and normal heart sounds.  Exam reveals no gallop and no friction rub.   No murmur heard. Pulmonary/Chest: Effort normal. He has no wheezes. He has no rales.  Post pneumonectomy breath sounds on R  Abdominal: Soft. Bowel sounds are normal. He exhibits distension. He exhibits no mass. There is tenderness. There is no rebound and no guarding.  Hepatomegaly   Musculoskeletal: Normal range of motion. He exhibits no edema.  Lymphadenopathy:    He has no cervical adenopathy.  Neurological: He is alert and oriented to person, place, and time. He has normal reflexes. No cranial nerve deficit. Coordination normal.  Skin: Skin is warm and dry. No rash noted.  Psychiatric: Mood, memory, affect and judgment normal.  Nursing note and vitals reviewed.   LABORATORY DATA:  Results for orders placed or performed during the hospital encounter of 10/27/14 (from the past 48 hour(s))  CBC     Status: Abnormal   Collection Time: 10/27/14  4:47 PM  Result Value Ref Range   WBC 9.0 4.0 - 10.5 K/uL   RBC 4.86 4.22 - 5.81 MIL/uL   Hemoglobin 13.5 13.0 - 17.0 g/dL   HCT 41.2 39.0 - 52.0 %   MCV 84.8 78.0 - 100.0 fL   MCH 27.8 26.0 - 34.0 pg   MCHC 32.8 30.0 - 36.0 g/dL   RDW 13.8 11.5 - 15.5 %   Platelets 142 (L) 150 - 400 K/uL  Comprehensive metabolic panel     Status: Abnormal   Collection Time:  10/27/14  4:47 PM  Result Value Ref Range   Sodium 133 (L) 135 - 145 mmol/L   Potassium 4.9 3.5 - 5.1 mmol/L   Chloride 95 (L) 101 - 111 mmol/L   CO2 30 22 - 32 mmol/L   Glucose, Bld 121 (H) 65 - 99 mg/dL   BUN 36 (H) 6 - 20 mg/dL   Creatinine, Ser 1.20 0.61 - 1.24 mg/dL   Calcium 8.8 (L) 8.9 - 10.3 mg/dL   Total Protein 6.3 (L) 6.5 - 8.1 g/dL   Albumin 3.4 (L) 3.5 - 5.0 g/dL   AST 78 (H) 15 - 41 U/L   ALT 31 17 - 63 U/L   Alkaline Phosphatase 220 (H) 38 - 126 U/L   Total Bilirubin 2.1 (H) 0.3 - 1.2 mg/dL   GFR calc non Af Amer 55 (L) >60 mL/min   GFR calc Af Amer >60 >  60 mL/min    Comment: (NOTE) The eGFR has been calculated using the CKD EPI equation. This calculation has not been validated in all clinical situations. eGFR's persistently <60 mL/min signify possible Chronic Kidney Disease.    Anion gap 8 5 - 15  TSH     Status: None   Collection Time: 10/27/14  4:47 PM  Result Value Ref Range   TSH 0.690 0.350 - 4.500 uIU/mL  Troponin I     Status: Abnormal   Collection Time: 10/27/14  4:47 PM  Result Value Ref Range   Troponin I 0.05 (H) <0.031 ng/mL    Comment:        PERSISTENTLY INCREASED TROPONIN VALUES IN THE RANGE OF 0.04-0.49 ng/mL CAN BE SEEN IN:       -UNSTABLE ANGINA       -CONGESTIVE HEART FAILURE       -MYOCARDITIS       -CHEST TRAUMA       -ARRYHTHMIAS       -LATE PRESENTING MYOCARDIAL INFARCTION       -COPD   CLINICAL FOLLOW-UP RECOMMENDED.   Troponin I     Status: Abnormal   Collection Time: 10/27/14 10:12 PM  Result Value Ref Range   Troponin I 0.05 (H) <0.031 ng/mL    Comment:        PERSISTENTLY INCREASED TROPONIN VALUES IN THE RANGE OF 0.04-0.49 ng/mL CAN BE SEEN IN:       -UNSTABLE ANGINA       -CONGESTIVE HEART FAILURE       -MYOCARDITIS       -CHEST TRAUMA       -ARRYHTHMIAS       -LATE PRESENTING MYOCARDIAL INFARCTION       -COPD   CLINICAL FOLLOW-UP RECOMMENDED.   Troponin I     Status: Abnormal   Collection Time: 10/28/14   4:04 AM  Result Value Ref Range   Troponin I 0.04 (H) <0.031 ng/mL    Comment:        PERSISTENTLY INCREASED TROPONIN VALUES IN THE RANGE OF 0.04-0.49 ng/mL CAN BE SEEN IN:       -UNSTABLE ANGINA       -CONGESTIVE HEART FAILURE       -MYOCARDITIS       -CHEST TRAUMA       -ARRYHTHMIAS       -LATE PRESENTING MYOCARDIAL INFARCTION       -COPD   CLINICAL FOLLOW-UP RECOMMENDED.   Basic metabolic panel     Status: Abnormal   Collection Time: 10/28/14  4:04 AM  Result Value Ref Range   Sodium 132 (L) 135 - 145 mmol/L   Potassium 3.9 3.5 - 5.1 mmol/L    Comment: DELTA CHECK NOTED   Chloride 93 (L) 101 - 111 mmol/L   CO2 28 22 - 32 mmol/L   Glucose, Bld 115 (H) 65 - 99 mg/dL   BUN 34 (H) 6 - 20 mg/dL   Creatinine, Ser 0.93 0.61 - 1.24 mg/dL   Calcium 8.5 (L) 8.9 - 10.3 mg/dL   GFR calc non Af Amer >60 >60 mL/min   GFR calc Af Amer >60 >60 mL/min    Comment: (NOTE) The eGFR has been calculated using the CKD EPI equation. This calculation has not been validated in all clinical situations. eGFR's persistently <60 mL/min signify possible Chronic Kidney Disease.    Anion gap 11 5 - 15  CBC     Status: Abnormal   Collection Time:  10/28/14  4:04 AM  Result Value Ref Range   WBC 10.7 (H) 4.0 - 10.5 K/uL   RBC 4.87 4.22 - 5.81 MIL/uL   Hemoglobin 13.4 13.0 - 17.0 g/dL   HCT 41.1 39.0 - 52.0 %   MCV 84.4 78.0 - 100.0 fL   MCH 27.5 26.0 - 34.0 pg   MCHC 32.6 30.0 - 36.0 g/dL   RDW 14.0 11.5 - 15.5 %   Platelets 138 (L) 150 - 400 K/uL      RADIOGRAPHY: Dg Chest 2 View  10/27/2014   CLINICAL DATA:  Short of breath, fever, history of prior right pneumonectomy  EXAM: CHEST  2 VIEW  COMPARISON:  Chest x-ray of 02/16/2014  FINDINGS: Opacification of the entire right hemi thorax is due to prior right pneumonectomy with fibrothorax. Mediastinal shift to the right is noted. The left lung is clear. Heart size is difficult to assess. There are degenerative changes throughout the thoracic  spine.  IMPRESSION: Stable changes of right pneumonectomy.  The left lung is clear.   Electronically Signed   By: Ivar Drape M.D.   On: 10/27/2014 11:21   Dg Abd 1 View  10/27/2014   CLINICAL DATA:  Abdominal pain, fever, history of right pneumonectomy  EXAM: ABDOMEN - 1 VIEW  COMPARISON:  CT abdomen pelvis of 12/26/2013 and abdomen plain films of 09/03/2005  FINDINGS: The supine film of the abdomen shows no bowel obstruction. No opaque calculi are seen. 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 There are degenerative changes within the lumbar spine.  IMPRESSION: No bowel obstruction. No opaque calculi. Prior right pneumonectomy.   Electronically Signed   By: Ivar Drape M.D.   On: 10/27/2014 11:20   Ct Abdomen Pelvis W Contrast  10/27/2014   CLINICAL DATA:  Abdominal swelling, palpable mass or lump in epigastric area, history of prostate cancer, RIGHT pneumonectomy for lung cancer, abnormal LFTs, atrial fibrillation, CHF  EXAM: CT ABDOMEN AND PELVIS WITH CONTRAST  TECHNIQUE: Multidetector CT imaging of the abdomen and pelvis was performed using the standard protocol following bolus administration of intravenous contrast. Sagittal and coronal MPR images reconstructed from axial data set.  CONTRAST:  117mL OMNIPAQUE IOHEXOL 300 MG/ML SOLN, 48mL OMNIPAQUE IOHEXOL 300 MG/ML SOLN  COMPARISON:  12/26/2013  FINDINGS: Fluid, pleural thickening and pleural calcification at inferior RIGHT hemi thorax post pneumonectomy.  LEFT lung base clear.  Scattered atherosclerotic calcifications aorta, abdominal branch vessels, and coronary arteries.  Numerous poorly defined low-attenuation masses within liver compatible with widespread hepatic metastatic disease.  Largest lesion is at the superior aspect of the LEFT lobe 9.3 x 6.4 cm image 10.  Lateral RIGHT lobe lesion 3.3 x 3.2 cm image 35.  Calcified gallstones within gallbladder.  Spleen, pancreas, kidneys and RIGHT  adrenal gland normal.  LEFT adrenal mass 2.1 x 1.8 cm image 19 previously 1.9 x 1.8 cm.  Prostatic enlargement, gland 5.6 x 5.1 cm image 75.  Abnormal soft tissue at the posterior bladder wall lateral to the prostate margins versus previous exam concerning extension of prostate tumor or bladder tumor, new.  Bladder calculus again noted 11 mm diameter.  Stomach and bowel loops grossly unremarkable.  No free air, hernia, or definite adenopathy.  Small amount of perihepatic free intraperitoneal fluid.  Appendix not visualized.  Scattered sclerotic osseous lesions compatible with sclerotic metastases progressive, including spine, pelvis, ribs, and proximal femora.  IMPRESSION: Extensive sclerotic osseous metastatic disease.  Extensive hepatic  metastatic disease.  Little change in size of LEFT adrenal nodule.  Cholelithiasis.  Post surgical changes of RIGHT pneumonectomy.   Electronically Signed   By: Lavonia Dana M.D.   On: 10/27/2014 19:39   Nm Pulmonary Perf And Vent  10/28/2014   CLINICAL DATA:  Dyspnea on exertion.  EXAM: NUCLEAR MEDICINE VENTILATION - PERFUSION LUNG SCAN  TECHNIQUE: Ventilation images were obtained in multiple projections using inhaled aerosol Tc-23m DTPA. Perfusion images were obtained in multiple projections after intravenous injection of Tc-29m MAA.  RADIOPHARMACEUTICALS:  42 mCi Technetium-69m DTPA aerosol inhalation and 6.2 mCi Technetium-29m MAA IV  COMPARISON:  Chest x-ray from yesterday  FINDINGS: No perfusion defects in the solitary left lung to suggest pulmonary embolism. Changes of mild airway turbulence with central radiotracer deposition on ventilation. Right pneumonectomy.  IMPRESSION: 1. Normal left lung perfusion.  Negative for pulmonary embolism. 2. Right pneumonectomy.   Electronically Signed   By: Monte Fantasia M.D.   On: 10/28/2014 09:18       PATHOLOGY:    [facility] [address] [place], [place] [address] [phone]  REPORT OF  SURGICAL PATHOLOGY  Case #: [id number] Patient Name: JEANCARLOS, MARCHENA. PID: [id number] Pathologist: Neldon Mc, MD DOB/Age 08/24/1931 (Age: 72) Gender: M Date Taken: 12/01/2004 Date Received: 12/02/2004  FINAL DIAGNOSIS  MICROSCOPIC EXAMINATION AND DIAGNOSIS  PROSTATE, RESECTION: - FOCAL ADENOSIS AND RARE PROSTATIC CRYSTALLOIDS,SEE COMMENT - GLANDULAR AND STROMA HYPERPLASIA - PERIGLANDULAR CHRONIC INFLAMMATION  COMMENT The sections show a prostate having numerous nodular areas consistent with glandular and stromal hyperplasia. There is patchy periglandular inflammation present as well. In one block, one nodule displays glands associated with rare prostatic crystalloids. Therefore, immunohistochemical stains are performed and shows these glands to have the following immunohistochemical profile: Cytokeratin 903 Partial retention of basal cell layer P504S Patchy positivity for racemase but high background All control' s stained appropriately. This nodule is circumscribed and the partial retention of a basal cell layer is supportive of a benign process, adenosis. There is patchy positivity for P504S (racemase) but adjacent hyperplastic glands also stain positive. Therefore, despite the presence of crystalloids, the findings are not diagnostic of carcinoma. Dr. Doreatha Massed has reviewed this case and concurs. The report is faxed to the office of Dr. Jeffie Pollock after electronic sign out by Dr. Brigitte Pulse. (JAS:gt) 12/05/04  gdt Date Reported: 12/06/2004 Neldon Mc, MD  Electronically Signed Out By JAS    ASSESSMENT:  Liver Metastases and Sclerotic Bone Metastases 13 pound weight loss History of Gleason Score 9 adenocarcinoma of prostate  Cancer associated dyspnea   PLAN:  Certainly highly suspicious for prostate cancer. Will get copies of records from Dr. Jeffie Pollock in regards to patient's prostate biopsy last year. Would consult IR for possible liver  biopsy. Given sclerotic nature of bony metastases, biopsy may be more difficult and lower yield.  Check PSA level.   Advised patient that if biopsy reveals metastatic prostate cancer I would recommend he consider [facility] agonist therapy. There is data that taxotere given at diagnosis of hormone naive metastatic prostate cancer prolongs survival. I am not sure he would be a candidate for such therapy. We will re-evaluate pending biopsy results.  We also discussed pursuing no therapy as he opted to not treat his prostate cancer late last year.  His dyspnea is most likely "cancer related"  Treating his anxiety may help. Noted that cardiology has also been consulted.   All questions were answered.  Will follow-up tomorrow. Please call  with questions. This note was electronically signed. Molli Hazard MD

## 2014-10-28 NOTE — Progress Notes (Signed)
Initial Nutrition Assessment  DOCUMENTATION CODES: Non-severe (moderate) malnutrition in context of chronic illness  INTERVENTION: Ensure Enlive po BID, each supplement provides 350 kcal and 20 grams of protein  Recommend MVI with minerals  NUTRITION DIAGNOSIS: Inadequate oral intake related to other lack of appetite as evidenced by loss of 8.3% bw in 4 months   GOAL: Patient will meet greater than or equal to 90% of their needs  MONITOR: Supplement acceptance, Labs, Weight trends, PO intake, work up  REASON FOR ASSESSMENT: Malnutrition Screening Tool   ASSESSMENT: 81 y.o. Male hx of CAD, HTN, HLD, afib,  lung + prostate CA, s/p partial prostatectomy, CHF, presented to PCP, found to have possible enlarged liver, fatigue, increased DOE for the past 4 months without any chest pain.He has not been feeling well, and has lost weight.  Pt reports that he has not had much of an appetite recently. He says today's lunch was the first meal he has had in 2 days and the first substantial food in 4 days.  He also reports a chronic loss of appetite as well. He is not sure why. He denies n/v/d/c though he says he will not have a BM for a few days and then have a very large one. He does not take any vitamins/minerals/supplements.  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 He has severe arthritis and says he can no longer straighten his back. His son states he is not active at all anymore  He has been losing weight for a while now. He no longer has a "normal weight" Son states that pt used to weigh 260 lbs before his lung cancer operation. Pt's son says the wt loss is very much visible. NFPE reveals significant fat loss and some mild muscle loss.   As of now, pt states his appetite is better and is agreeable to supplements.   Height: Ht Readings from Last 1 Encounters:  10/27/14 '5\' 10"'$  (1.778 m)    Weight: Wt Readings from Last 1 Encounters:  10/27/14 165 lb  3.2 oz (74.934 kg)    Ideal Body Weight:  75.45 kg  Wt Readings from Last 10 Encounters:  10/27/14 165 lb 3.2 oz (74.934 kg)  10/27/14 168 lb (76.204 kg)  06/23/14 180 lb (81.647 kg)  02/16/14 172 lb (78.019 kg)  10/13/13 181 lb (82.101 kg)  04/28/13 187 lb (84.823 kg)  11/13/12 190 lb 12.8 oz (86.546 kg)  09/26/11 200 lb (90.719 kg)  09/20/11 200 lb (90.719 kg)  09/14/11 199 lb (90.266 kg)  Would appear to have lost 15 lbs in 4 months (8.3% not significant)  BMI:  Body mass index is 23.7 kg/(m^2).  Estimated Nutritional Needs: Kcal:  1750-1950 (23-26 kcal/kg) Protein:  90-105g (1.2-1.4 g/kg) Fluid:  1.8-2 liters  Skin:  Dry w/ ecchymosis  Diet Order:  Diet Heart Room service appropriate?: Yes; Fluid consistency:: Thin  EDUCATION NEEDS:  No education needs identified at this time   Intake/Output Summary (Last 24 hours) at 10/28/14 1345 Last data filed at 10/28/14 1129  Gross per 24 hour  Intake  920.5 ml  Output      0 ml  Net  920.5 ml   Last BM:  6/12  Burtis Junes RD, LDN Nutrition Pager: [phone] 10/28/2014 1:45 PM

## 2014-10-28 NOTE — Progress Notes (Signed)
UR chart review completed.  

## 2014-10-28 NOTE — Progress Notes (Signed)
[facility] PROGRESS NOTE  Craig Dean QBV:[id number] DOB: [birth year] DOA: 10/27/2014 PCP: Redge Gainer, MD  Assessment/Plan: 1. Hepatic metastases. Primary malignancy is unknown. Oncology consultation appreciated. They've recommended liver biopsy further tissue diagnosis. We'll request interventional radiology to evaluate the patient for possible biopsy. We'll send PSA as well as CEA. Hold Lovenox dosing today and keep patient nothing by mouth after midnight. 2. Previous history of lung cancer status post right pneumonectomy. 3. Dyspnea on exertion. Likely multifactorial in the setting of underlying malignancy, deconditioning. Chest x-ray showed clear left lung. VQ scan was low probability study for pulmonary embolus. He does not have any evidence of volume overload. 4. Chronic systolic congestive heart failure. Ejection fraction of 40-45%. Does have wall motion abnormalities. Troponins are mildly elevated at 0.05 and 0.04. He does not have any EKG changes or typical chest pain. This will likely need follow-up in the outpatient setting with his primary cardiologist. Last cardiac catheterization was 2013 where RCA was noted to be completely occluded and had associated collaterals. With his other comorbidities, we'll likely pursue further cardiac workup as an outpatient. 5. Chronic atrial fibrillation. Will hold Cardizem for now since blood pressures are running low. Heart rate appears to be controlled. Continue aspirin.  Code Status: Full code Family Communication: Discussed with patient, wife and son at the bedside Disposition Plan: Pending hospital course   Consultants:  Oncology  Procedures:    Antibiotics:    HPI/Subjective: Patient reports that he has had exertional dyspnea for the past 5 weeks. He reports losing weight. He has felt generalized weakness. He has not had any chest pain, but describes bony pain throughout his chest, upper and lower back.  Objective: Filed  Vitals:   10/28/14 1438  BP: 70/45  Pulse: 57  Temp: 99.4 F (37.4 C)  Resp: 19    Intake/Output Summary (Last 24 hours) at 10/28/14 1804 Last data filed at 10/28/14 1535  Gross per 24 hour  Intake 1541.34 ml  Output      0 ml  Net 1541.34 ml   Filed Weights   10/27/14 1426  Weight: 74.934 kg (165 lb 3.2 oz)    Exam:   General:  NAD  Cardiovascular: S1, S2 irregular  Respiratory: clear on left side, s/p right pneumonectomy  Abdomen: soft, tender in ruq, nd, bs+  Musculoskeletal: no edema b/l   Data Reviewed: Basic Metabolic Panel:  Recent Labs Lab 10/26/14 1624 10/27/14 1647 10/28/14 0404  NA 137 133* 132*  K 4.5 4.9 3.9  CL 93* 95* 93*  CO2 '24 30 28  '$ GLUCOSE 122* 121* 115*  BUN 33* 36* 34*  CREATININE 1.40* 1.20 0.93  CALCIUM 9.5 8.8* 8.5*   Liver Function Tests:  Recent Labs Lab 10/26/14 1624 10/27/14 1647  AST 93* 78*  ALT 37 31  ALKPHOS 274* 220*  BILITOT 1.3* 2.1*  PROT 6.4 6.3*  ALBUMIN  --  3.4*   No results for input(s): LIPASE, AMYLASE in the last 168 hours. No results for input(s): AMMONIA in the last 168 hours. CBC:  Recent Labs Lab 10/26/14 1626 10/27/14 1647 10/28/14 0404  WBC 9.7 9.0 10.7*  HGB 14.1 13.5 13.4  HCT 45.1 41.2 41.1  MCV 83.9 84.8 84.4  PLT  --  142* 138*   Cardiac Enzymes:  Recent Labs Lab 10/27/14 1647 10/27/14 2212 10/28/14 0404  TROPONINI 0.05* 0.05* 0.04*   BNP (last 3 results) No results for input(s): BNP in the last 8760 hours.  ProBNP (last 3 results)  No results for input(s): PROBNP in the last 8760 hours.  CBG: No results for input(s): GLUCAP in the last 168 hours.  Recent Results (from the past 240 hour(s))  Urine culture     Status: None   Collection Time: 10/27/14 10:51 AM  Result Value Ref Range Status   Urine Culture, Routine Final report  Final   Result 1 Comment  Final    Comment: Mixed urogenital flora Less than 10,000 colonies/mL      Studies: Dg Chest 2  View  10/27/2014   CLINICAL DATA:  Short of breath, fever, history of prior right pneumonectomy  EXAM: CHEST  2 VIEW  COMPARISON:  Chest x-ray of 02/16/2014  FINDINGS: Opacification of the entire right hemi thorax is due to prior right pneumonectomy with fibrothorax. Mediastinal shift to the right is noted. The left lung is clear. Heart size is difficult to assess. There are degenerative changes throughout the thoracic spine.  IMPRESSION: Stable changes of right pneumonectomy.  The left lung is clear.   Electronically Signed   By: Ivar Drape M.D.   On: 10/27/2014 11:21   Dg Abd 1 View  10/27/2014   CLINICAL DATA:  Abdominal pain, fever, history of right pneumonectomy  EXAM: ABDOMEN - 1 VIEW  COMPARISON:  CT abdomen pelvis of 12/26/2013 and abdomen plain films of 09/03/2005  FINDINGS: The supine film of the abdomen shows no bowel obstruction. No opaque calculi are seen. 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 There are degenerative changes within the lumbar spine.  IMPRESSION: No bowel obstruction. No opaque calculi. Prior right pneumonectomy.   Electronically Signed   By: Ivar Drape M.D.   On: 10/27/2014 11:20   Ct Abdomen Pelvis W Contrast  10/27/2014   CLINICAL DATA:  Abdominal swelling, palpable mass or lump in epigastric area, history of prostate cancer, RIGHT pneumonectomy for lung cancer, abnormal LFTs, atrial fibrillation, CHF  EXAM: CT ABDOMEN AND PELVIS WITH CONTRAST  TECHNIQUE: Multidetector CT imaging of the abdomen and pelvis was performed using the standard protocol following bolus administration of intravenous contrast. Sagittal and coronal MPR images reconstructed from axial data set.  CONTRAST:  159m OMNIPAQUE IOHEXOL 300 MG/ML SOLN, 549mOMNIPAQUE IOHEXOL 300 MG/ML SOLN  COMPARISON:  12/26/2013  FINDINGS: Fluid, pleural thickening and pleural calcification at inferior RIGHT hemi thorax post pneumonectomy.  LEFT lung base clear.   Scattered atherosclerotic calcifications aorta, abdominal branch vessels, and coronary arteries.  Numerous poorly defined low-attenuation masses within liver compatible with widespread hepatic metastatic disease.  Largest lesion is at the superior aspect of the LEFT lobe 9.3 x 6.4 cm image 10.  Lateral RIGHT lobe lesion 3.3 x 3.2 cm image 35.  Calcified gallstones within gallbladder.  Spleen, pancreas, kidneys and RIGHT adrenal gland normal.  LEFT adrenal mass 2.1 x 1.8 cm image 19 previously 1.9 x 1.8 cm.  Prostatic enlargement, gland 5.6 x 5.1 cm image 75.  Abnormal soft tissue at the posterior bladder wall lateral to the prostate margins versus previous exam concerning extension of prostate tumor or bladder tumor, new.  Bladder calculus again noted 11 mm diameter.  Stomach and bowel loops grossly unremarkable.  No free air, hernia, or definite adenopathy.  Small amount of perihepatic free intraperitoneal fluid.  Appendix not visualized.  Scattered sclerotic osseous lesions compatible with sclerotic metastases progressive, including spine, pelvis, ribs, and proximal femora.  IMPRESSION: Extensive sclerotic osseous metastatic disease.  Extensive hepatic metastatic disease.  Little  change in size of LEFT adrenal nodule.  Cholelithiasis.  Post surgical changes of RIGHT pneumonectomy.   Electronically Signed   By: Lavonia Dana M.D.   On: 10/27/2014 19:39   Nm Pulmonary Perf And Vent  10/28/2014   CLINICAL DATA:  Dyspnea on exertion.  EXAM: NUCLEAR MEDICINE VENTILATION - PERFUSION LUNG SCAN  TECHNIQUE: Ventilation images were obtained in multiple projections using inhaled aerosol Tc-90mDTPA. Perfusion images were obtained in multiple projections after intravenous injection of Tc-923mAA.  RADIOPHARMACEUTICALS:  42 mCi Technetium-99110mPA aerosol inhalation and 6.2 mCi Technetium-22m31m IV  COMPARISON:  Chest x-ray from yesterday  FINDINGS: No perfusion defects in the solitary left lung to suggest pulmonary  embolism. Changes of mild airway turbulence with central radiotracer deposition on ventilation. Right pneumonectomy.  IMPRESSION: 1. Normal left lung perfusion.  Negative for pulmonary embolism. 2. Right pneumonectomy.   Electronically Signed   By: JonaMonte Fantasia.   On: 10/28/2014 09:18    Scheduled Meds: . acetaminophen  650 mg Oral 3 times per day  . aspirin  81 mg Oral Daily  . enoxaparin (LOVENOX) injection  40 mg Subcutaneous Q24H  . feeding supplement (ENSURE ENLIVE)  237 mL Oral BID BM  . FLUoxetine  10 mg Oral Daily  . sodium chloride  3 mL Intravenous Q12H   Continuous Infusions: . dextrose 5 % and 0.9% NaCl 50 mL/hr at 10/28/14 1644    Principal Problem:   Abdominal swelling, mass, or lump Active Problems:   HTN (hypertension)   Coronary atherosclerosis   Atrial fibrillation   Chronic systolic congestive heart failure   Arthropathy   History of prostatectomy   Prostate cancer   H/O pneumonectomy   Abdominal pain   Malnutrition of moderate degree   Hepatic metastases    Time spent: 30mi72m  MEMON,JEHANZEB  [facility] Pager [phone]-7AM, please contact night-coverage at [email or web address], password [email or web address], 6:04 PM  LOS: 1 day

## 2014-10-29 ENCOUNTER — Ambulatory Visit (HOSPITAL_COMMUNITY)
Admit: 2014-10-29 | Discharge: 2014-10-29 | Disposition: A | Discharge status: Home / Self Care | Payer: Medicare Other | Attending: Internal Medicine | Admitting: Internal Medicine

## 2014-10-29 ENCOUNTER — Inpatient Hospital Stay (HOSPITAL_COMMUNITY): Payer: Medicare Other

## 2014-10-29 DIAGNOSIS — I4891 Unspecified atrial fibrillation: Secondary | ICD-10-CM

## 2014-10-29 DIAGNOSIS — R160 Hepatomegaly, not elsewhere classified: Secondary | ICD-10-CM | POA: Insufficient documentation

## 2014-10-29 DIAGNOSIS — Z902 Acquired absence of lung [part of]: Secondary | ICD-10-CM

## 2014-10-29 LAB — CBC
HCT: 37 % — ABNORMAL LOW (ref 39.0–52.0)
HEMOGLOBIN: 12 g/dL — AB (ref 13.0–17.0)
MCH: 27.3 pg (ref 26.0–34.0)
MCHC: 32.4 g/dL (ref 30.0–36.0)
MCV: 84.3 fL (ref 78.0–100.0)
Platelets: 138 10*3/uL — ABNORMAL LOW (ref 150–400)
RBC: 4.39 MIL/uL (ref 4.22–5.81)
RDW: 13.8 % (ref 11.5–15.5)
WBC: 8.9 10*3/uL (ref 4.0–10.5)

## 2014-10-29 LAB — BASIC METABOLIC PANEL
Anion gap: 9 (ref 5–15)
BUN: 40 mg/dL — AB (ref 6–20)
CO2: 31 mmol/L (ref 22–32)
Calcium: 8.4 mg/dL — ABNORMAL LOW (ref 8.9–10.3)
Chloride: 95 mmol/L — ABNORMAL LOW (ref 101–111)
Creatinine, Ser: 1.21 mg/dL (ref 0.61–1.24)
GFR calc Af Amer: >60 mL/min (ref >60)
GFR calc non Af Amer: 55 mL/min — ABNORMAL LOW (ref >60)
GLUCOSE: 155 mg/dL — AB (ref 65–99)
Potassium: 3.6 mmol/L (ref 3.5–5.1)
Sodium: 135 mmol/L (ref 135–145)

## 2014-10-29 LAB — CEA: CEA: 3.3 ng/mL (ref 0.0–4.7)

## 2014-10-29 LAB — PSA, TOTAL AND FREE
PSA, Free Pct: 21.9 %
PSA, Free: 14.17 ng/mL
Prostate Specific Ag, Serum: 64.6 ng/mL — ABNORMAL HIGH (ref 0.0–4.0)

## 2014-10-29 LAB — SPECIMEN STATUS REPORT

## 2014-10-29 LAB — MAGNESIUM: Magnesium: 1.9 mg/dL (ref 1.7–2.4)

## 2014-10-29 MED ORDER — POTASSIUM CHLORIDE CRYS ER 20 MEQ PO TBCR: 40.0000 meq | EXTENDED_RELEASE_TABLET | Freq: Once | ORAL | Status: DC

## 2014-10-29 MED ORDER — FENTANYL CITRATE (PF) 100 MCG/2ML IJ SOLN
INTRAMUSCULAR | Status: AC
  Filled 2014-10-29: qty 2

## 2014-10-29 MED ORDER — MIDAZOLAM HCL 2 MG/2ML IJ SOLN
INTRAMUSCULAR | Status: AC | PRN
  Administered 2014-10-29: 1 mg via INTRAVENOUS

## 2014-10-29 MED ORDER — FENTANYL CITRATE (PF) 100 MCG/2ML IJ SOLN
INTRAMUSCULAR | Status: AC | PRN
  Administered 2014-10-29: 25 ug via INTRAVENOUS

## 2014-10-29 MED ORDER — MIDAZOLAM HCL 2 MG/2ML IJ SOLN
INTRAMUSCULAR | Status: AC
  Filled 2014-10-29: qty 2

## 2014-10-29 MED ORDER — LIDOCAINE HCL (PF) 1 % IJ SOLN
INTRAMUSCULAR | Status: AC
  Filled 2014-10-29: qty 10

## 2014-10-29 MED ORDER — CARVEDILOL 3.125 MG PO TABS
6.2500 mg | ORAL_TABLET | Freq: Two times a day (BID) | ORAL | Status: DC
  Administered 2014-10-30 (×2): 6.25 mg via ORAL
  Filled 2014-10-29 (×2): qty 2

## 2014-10-29 MED ORDER — HYDROCODONE-ACETAMINOPHEN 5-325 MG PO TABS: 1.0000 | ORAL_TABLET | ORAL | Status: DC | PRN

## 2014-10-29 NOTE — Sedation Documentation (Signed)
Procedure actually started at 1655. Timeout was shown a little bit earlier. MD had emergent call.

## 2014-10-29 NOTE — Progress Notes (Signed)
[facility] PROGRESS NOTE  Izell Labat IHK:[id number] DOB: 05-21-1931 DOA: 10/27/2014 PCP: Redge Gainer, MD  Assessment/Plan: 1. Hepatic metastases. Primary malignancy is unknown. Oncology consultation appreciated. Plans are for liver biopsy today. PSA noted to be elevated 51.59 and CEA in normal range. Patient does have a history of prostate cancer. 2. Previous history of lung cancer status post right pneumonectomy. 3. Dyspnea on exertion. Likely multifactorial in the setting of underlying malignancy, deconditioning. Chest x-ray showed clear left lung. VQ scan was low probability study for pulmonary embolus. He does not have any evidence of volume overload. 4. Chronic systolic congestive heart failure. Ejection fraction of 40-45%. Does have wall motion abnormalities on echo. Troponins are mildly elevated at 0.05 and 0.04. He does not have any EKG changes or typical chest pain.  Last cardiac catheterization was 2013 where RCA was noted to be completely occluded and had associated collaterals. Recommendations were for medical management. With his other comorbidities, we'll likely pursue further cardiac workup as an outpatient. Discussed with Dr. Harrington Challenger who agrees with outpatient follow up. 5. Chronic atrial fibrillation. Will hold Cardizem for now since blood pressures are running low. Heart rate appears to be controlled. Continue aspirin.  Code Status: Full code Family Communication: Discussed with patient at the bedside Disposition Plan: Pending hospital course   Consultants:  Oncology  Interventional radiology  Procedures:    Antibiotics:    HPI/Subjective: Denies any worsening shortness of breath or pain. No nausea or vomiting  Objective: Filed Vitals:   10/29/14 0630  BP: 107/50  Pulse: 56  Temp: 97.6 F (36.4 C)  Resp: 16    Intake/Output Summary (Last 24 hours) at 10/29/14 1146 Last data filed at 10/29/14 1114  Gross per 24 hour  Intake 912.17 ml  Output       0 ml  Net 912.17 ml   Filed Weights   10/27/14 1426  Weight: 74.934 kg (165 lb 3.2 oz)    Exam:   General:  NAD, laying in bed  Cardiovascular: S1, S2 RRR  Respiratory: clear breath sounds on left, s/p right pneumonectomy  Abdomen: soft, tender in ruq, nd, bs+  Musculoskeletal: no edema b/l   Data Reviewed: Basic Metabolic Panel:  Recent Labs Lab 10/26/14 1624 10/27/14 1647 10/28/14 0404 10/29/14 0556  NA 137 133* 132* 135  K 4.5 4.9 3.9 3.6  CL 93* 95* 93* 95*  CO2 '24 30 28 31  '$ GLUCOSE 122* 121* 115* 155*  BUN 33* 36* 34* 40*  CREATININE 1.40* 1.20 0.93 1.21  CALCIUM 9.5 8.8* 8.5* 8.4*   Liver Function Tests:  Recent Labs Lab 10/26/14 1624 10/27/14 1647  AST 93* 78*  ALT 37 31  ALKPHOS 274* 220*  BILITOT 1.3* 2.1*  PROT 6.4 6.3*  ALBUMIN  --  3.4*   No results for input(s): LIPASE, AMYLASE in the last 168 hours. No results for input(s): AMMONIA in the last 168 hours. CBC:  Recent Labs Lab 10/26/14 1626 10/27/14 1647 10/28/14 0404 10/29/14 0556  WBC 9.7 9.0 10.7* 8.9  HGB 14.1 13.5 13.4 12.0*  HCT 45.1 41.2 41.1 37.0*  MCV 83.9 84.8 84.4 84.3  PLT  --  142* 138* 138*   Cardiac Enzymes:  Recent Labs Lab 10/27/14 1647 10/27/14 2212 10/28/14 0404  TROPONINI 0.05* 0.05* 0.04*   BNP (last 3 results) No results for input(s): BNP in the last 8760 hours.  ProBNP (last 3 results) No results for input(s): PROBNP in the last 8760 hours.  CBG: No results  for input(s): GLUCAP in the last 168 hours.  Recent Results (from the past 240 hour(s))  Urine culture     Status: None   Collection Time: 10/27/14 10:51 AM  Result Value Ref Range Status   Urine Culture, Routine Final report  Final   Result 1 Comment  Final    Comment: Mixed urogenital flora Less than 10,000 colonies/mL      Studies: Ct Abdomen Pelvis W Contrast  10/27/2014   CLINICAL DATA:  Abdominal swelling, palpable mass or lump in epigastric area, history of prostate  cancer, RIGHT pneumonectomy for lung cancer, abnormal LFTs, atrial fibrillation, CHF  EXAM: CT ABDOMEN AND PELVIS WITH CONTRAST  TECHNIQUE: Multidetector CT imaging of the abdomen and pelvis was performed using the standard protocol following bolus administration of intravenous contrast. Sagittal and coronal MPR images reconstructed from axial data set.  CONTRAST:  122m OMNIPAQUE IOHEXOL 300 MG/ML SOLN, 520mOMNIPAQUE IOHEXOL 300 MG/ML SOLN  COMPARISON:  12/26/2013  FINDINGS: Fluid, pleural thickening and pleural calcification at inferior RIGHT hemi thorax post pneumonectomy.  LEFT lung base clear.  Scattered atherosclerotic calcifications aorta, abdominal branch vessels, and coronary arteries.  Numerous poorly defined low-attenuation masses within liver compatible with widespread hepatic metastatic disease.  Largest lesion is at the superior aspect of the LEFT lobe 9.3 x 6.4 cm image 10.  Lateral RIGHT lobe lesion 3.3 x 3.2 cm image 35.  Calcified gallstones within gallbladder.  Spleen, pancreas, kidneys and RIGHT adrenal gland normal.  LEFT adrenal mass 2.1 x 1.8 cm image 19 previously 1.9 x 1.8 cm.  Prostatic enlargement, gland 5.6 x 5.1 cm image 75.  Abnormal soft tissue at the posterior bladder wall lateral to the prostate margins versus previous exam concerning extension of prostate tumor or bladder tumor, new.  Bladder calculus again noted 11 mm diameter.  Stomach and bowel loops grossly unremarkable.  No free air, hernia, or definite adenopathy.  Small amount of perihepatic free intraperitoneal fluid.  Appendix not visualized.  Scattered sclerotic osseous lesions compatible with sclerotic metastases progressive, including spine, pelvis, ribs, and proximal femora.  IMPRESSION: Extensive sclerotic osseous metastatic disease.  Extensive hepatic metastatic disease.  Little change in size of LEFT adrenal nodule.  Cholelithiasis.  Post surgical changes of RIGHT pneumonectomy.   Electronically Signed   By: MaLavonia Dana.D.   On: 10/27/2014 19:39   Nm Pulmonary Perf And Vent  10/28/2014   CLINICAL DATA:  Dyspnea on exertion.  EXAM: NUCLEAR MEDICINE VENTILATION - PERFUSION LUNG SCAN  TECHNIQUE: Ventilation images were obtained in multiple projections using inhaled aerosol Tc-9976mPA. Perfusion images were obtained in multiple projections after intravenous injection of Tc-42m1m.  RADIOPHARMACEUTICALS:  42 mCi Technetium-42m 22m aerosol inhalation and 6.2 mCi Technetium-42m M22mV  COMPARISON:  Chest x-ray from yesterday  FINDINGS: No perfusion defects in the solitary left lung to suggest pulmonary embolism. Changes of mild airway turbulence with central radiotracer deposition on ventilation. Right pneumonectomy.  IMPRESSION: 1. Normal left lung perfusion.  Negative for pulmonary embolism. 2. Right pneumonectomy.   Electronically Signed   By: JonathMonte Fantasia  On: 10/28/2014 09:18    Scheduled Meds: . acetaminophen  650 mg Oral 3 times per day  . aspirin  81 mg Oral Daily  . enoxaparin (LOVENOX) injection  40 mg Subcutaneous Q24H  . feeding supplement (ENSURE ENLIVE)  237 mL Oral BID BM  . FLUoxetine  10 mg Oral Daily  . sodium chloride  3 mL Intravenous Q12H  Continuous Infusions: . dextrose 5 % and 0.9% NaCl 50 mL/hr at 10/29/14 1120    Principal Problem:   Abdominal swelling, mass, or lump Active Problems:   HTN (hypertension)   Coronary atherosclerosis   Atrial fibrillation   Chronic systolic congestive heart failure   Arthropathy   History of prostatectomy   Prostate cancer   H/O pneumonectomy   Abdominal pain   Malnutrition of moderate degree   Hepatic metastases    Time spent: 60mns    MEMON,JEHANZEB  [facility] Pager [phone] If 7PM-7AM, please contact night-coverage at [email or web address], password [email or web address]6/16/2016, 11:46 AM  LOS: 2 days

## 2014-10-29 NOTE — H&P (Signed)
Chief Complaint: Liver mass  Referring Physician(s): Memon,Jehanzeb  History of Present Illness: Craig Dean is a 81 y.o. male with h/o lung cancer and previous pneumonectomy about 18 years ago.    He also has h/o CHF and h/o prostate cancer and had a partial prostatectomy.  He presented to his PCP c/o increasing DOE.  During workup he was found to have a slight elevation of his LFTs and possibly an enlarged liver.  CT was done and there were multiple masses seen in the liver.  We are asked to perform an US guided liver biopsy today.    Past Medical History  Diagnosis Date  . HYPERLIPIDEMIA   . HYPERTENSION   . CAD   . Aortic valve disorders   . Atrial fibrillation   . CHF   . Edema   . ITP (idiopathic thrombocytopenic purpura)   . Lung cancer   . Prostate cancer     Past Surgical History  Procedure Laterality Date  . Carpal tunnel release    . Pneumonectomy    . Prostatectomy      Allergies: Penicillins and Statins  Medications: Prior to Admission medications   Medication Sig Start Date End Date Taking? Authorizing Provider  acetaminophen (TYLENOL ARTHRITIS PAIN) 650 MG CR tablet Take 650 mg by mouth every 8 (eight) hours as needed.      Historical Provider, MD  aspirin 81 MG tablet Take 81 mg by mouth daily.      Historical Provider, MD  diltiazem (TIAZAC) 300 MG 24 hr capsule TAKE 1 CAPSULE (300 MG TOTAL) BY MOUTH DAILY. 09/07/14   Chipper Herb, MD  enalapril (VASOTEC) 20 MG tablet TAKE 1 TABLET (20 MG TOTAL) BY MOUTH 2 (TWO) TIMES DAILY. 07/16/14   Chipper Herb, MD  hydrochlorothiazide (MICROZIDE) 12.5 MG capsule TAKE 1 TABLET (12.5 MG TOTAL) BY MOUTH DAILY. 08/28/14   Chipper Herb, MD     Family History  Problem Relation Age of Onset  . Alzheimer's disease Father     History   Social History  . Marital Status: Married    Spouse Name: N/A  . Number of Children: N/A  . Years of Education: N/A   Social History Main Topics  . Smoking  status: Former Smoker    Types: Cigarettes    Quit date: 05/15/1982  . Smokeless tobacco: Not on file  . Alcohol Use: No  . Drug Use: No  . Sexual Activity: Not on file   Other Topics Concern  . Not on file   Social History Narrative      Review of Systems  Constitutional: Positive for activity change. Negative for fever and appetite change.  HENT: Negative.   Respiratory: Positive for shortness of breath. Negative for chest tightness.   Cardiovascular: Negative for chest pain.  Gastrointestinal: Positive for abdominal pain. Negative for nausea and vomiting.  Musculoskeletal: Negative.   Skin: Negative.   Neurological: Negative.   Psychiatric/Behavioral: Negative.     Vital Signs: There were no vitals taken for this visit.  Physical Exam  Constitutional: He appears well-developed and well-nourished.  HENT:  Head: Normocephalic and atraumatic.  Eyes: EOM are normal.  Neck: Neck supple.  Cardiovascular: Normal rate, regular rhythm and normal heart sounds.   No murmur heard. Pulmonary/Chest: Effort normal.  Left lung clear, right s/p pneumonectomy  Vitals reviewed.   Mallampati Score:  MD Evaluation Airway: WNL Heart: WNL Abdomen: WNL Chest/ Lungs: WNL ASA  Classification: 1 Mallampati/Airway Score:  Three  Imaging: Dg Chest 2 View  10/27/2014   CLINICAL DATA:  Short of breath, fever, history of prior right pneumonectomy  EXAM: CHEST  2 VIEW  COMPARISON:  Chest x-ray of 02/16/2014  FINDINGS: Opacification of the entire right hemi thorax is due to prior right pneumonectomy with fibrothorax. Mediastinal shift to the right is noted. The left lung is clear. Heart size is difficult to assess. There are degenerative changes throughout the thoracic spine.  IMPRESSION: Stable changes of right pneumonectomy.  The left lung is clear.   Electronically Signed   By: Ivar Drape M.D.   On: 10/27/2014 11:21   Dg Abd 1 View  10/27/2014   CLINICAL DATA:  Abdominal pain, fever,  history of right pneumonectomy  EXAM: ABDOMEN - 1 VIEW  COMPARISON:  CT abdomen pelvis of 12/26/2013 and abdomen plain films of 09/03/2005  FINDINGS: The supine film of the abdomen shows no bowel obstruction. No opaque calculi are seen. 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 There are degenerative changes within the lumbar spine.  IMPRESSION: No bowel obstruction. No opaque calculi. Prior right pneumonectomy.   Electronically Signed   By: Ivar Drape M.D.   On: 10/27/2014 11:20   Ct Abdomen Pelvis W Contrast  10/27/2014   CLINICAL DATA:  Abdominal swelling, palpable mass or lump in epigastric area, history of prostate cancer, RIGHT pneumonectomy for lung cancer, abnormal LFTs, atrial fibrillation, CHF  EXAM: CT ABDOMEN AND PELVIS WITH CONTRAST  TECHNIQUE: Multidetector CT imaging of the abdomen and pelvis was performed using the standard protocol following bolus administration of intravenous contrast. Sagittal and coronal MPR images reconstructed from axial data set.  CONTRAST:  133m OMNIPAQUE IOHEXOL 300 MG/ML SOLN, 518mOMNIPAQUE IOHEXOL 300 MG/ML SOLN  COMPARISON:  12/26/2013  FINDINGS: Fluid, pleural thickening and pleural calcification at inferior RIGHT hemi thorax post pneumonectomy.  LEFT lung base clear.  Scattered atherosclerotic calcifications aorta, abdominal branch vessels, and coronary arteries.  Numerous poorly defined low-attenuation masses within liver compatible with widespread hepatic metastatic disease.  Largest lesion is at the superior aspect of the LEFT lobe 9.3 x 6.4 cm image 10.  Lateral RIGHT lobe lesion 3.3 x 3.2 cm image 35.  Calcified gallstones within gallbladder.  Spleen, pancreas, kidneys and RIGHT adrenal gland normal.  LEFT adrenal mass 2.1 x 1.8 cm image 19 previously 1.9 x 1.8 cm.  Prostatic enlargement, gland 5.6 x 5.1 cm image 75.  Abnormal soft tissue at the posterior bladder wall lateral to the prostate margins  versus previous exam concerning extension of prostate tumor or bladder tumor, new.  Bladder calculus again noted 11 mm diameter.  Stomach and bowel loops grossly unremarkable.  No free air, hernia, or definite adenopathy.  Small amount of perihepatic free intraperitoneal fluid.  Appendix not visualized.  Scattered sclerotic osseous lesions compatible with sclerotic metastases progressive, including spine, pelvis, ribs, and proximal femora.  IMPRESSION: Extensive sclerotic osseous metastatic disease.  Extensive hepatic metastatic disease.  Little change in size of LEFT adrenal nodule.  Cholelithiasis.  Post surgical changes of RIGHT pneumonectomy.   Electronically Signed   By: MaLavonia Dana.D.   On: 10/27/2014 19:39   Nm Pulmonary Perf And Vent  10/28/2014   CLINICAL DATA:  Dyspnea on exertion.  EXAM: NUCLEAR MEDICINE VENTILATION - PERFUSION LUNG SCAN  TECHNIQUE: Ventilation images were obtained in multiple projections using inhaled aerosol Tc-9943mPA. Perfusion images were obtained in multiple projections after intravenous injection  of Tc-85mMAA.  RADIOPHARMACEUTICALS:  42 mCi Technetium-973mTPA aerosol inhalation and 6.2 mCi Technetium-9919mA IV  COMPARISON:  Chest x-ray from yesterday  FINDINGS: No perfusion defects in the solitary left lung to suggest pulmonary embolism. Changes of mild airway turbulence with central radiotracer deposition on ventilation. Right pneumonectomy.  IMPRESSION: 1. Normal left lung perfusion.  Negative for pulmonary embolism. 2. Right pneumonectomy.   Electronically Signed   By: JonMonte FantasiaD.   On: 10/28/2014 09:18    Labs:  CBC:  Recent Labs  10/26/14 1626 10/27/14 1647 10/28/14 0404 10/29/14 0556  WBC 9.7 9.0 10.7* 8.9  HGB 14.1 13.5 13.4 12.0*  HCT 45.1 41.2 41.1 37.0*  PLT  --  142* 138* 138*    COAGS:  Recent Labs  10/28/14 1823  INR 1.33    BMP:  Recent Labs  10/26/14 1624 10/27/14 1647 10/28/14 0404 10/29/14 0556  NA 137 133* 132*  135  K 4.5 4.9 3.9 3.6  CL 93* 95* 93* 95*  CO2 '24 30 28 31  '$ GLUCOSE 122* 121* 115* 155*  BUN 33* 36* 34* 40*  CALCIUM 9.5 8.8* 8.5* 8.4*  CREATININE 1.40* 1.20 0.93 1.21  GFRNONAA 47* 55* >60 55*  GFRAA 54* >60 >60 >60    LIVER FUNCTION TESTS:  Recent Labs  02/12/14 1030 06/22/14 1224 10/26/14 1624 10/27/14 1647  BILITOT 0.7 0.4 1.3* 2.1*  AST 14 14 93* 78*  ALT 11 10 37 31  ALKPHOS 72 67 274* 220*  PROT 6.5 6.6 6.4 6.3*  ALBUMIN  --   --   --  3.4*    TUMOR MARKERS:  Recent Labs  10/28/14 1823  CEA 3.3    Assessment and Plan:  Liver Masses  H/O Pneumonectomy secondary to Lung Cancer  Will proceed with US [place]ided Liver Biopsy today by Dr. HasVernard Gamblesisks and Benefits discussed with the patient including, but not limited to bleeding, infection, damage to adjacent structures or low yield requiring additional tests. All of the patient's questions were answered, patient is agreeable to proceed. Consent signed and in chart.  Thank you for this interesting consult.  I greatly enjoyed meeting WilJakeb Lampingd look forward to participating in their care.  Signed: WMurrell Redden16/2016, 3:45 PM   I spent a total of 20 Minutes    in face to face in clinical consultation, greater than 50% of which was counseling/coordinating care for US [place]ided liver biopsy.

## 2014-10-29 NOTE — Progress Notes (Signed)
Patient had 16 beat run of Vtach. Patient asymptomatic. Dr. Roderic Palau notified.

## 2014-10-29 NOTE — Progress Notes (Signed)
Report given to Estill Bamberg at [facility].

## 2014-10-29 NOTE — Progress Notes (Signed)
Stayed flat in bed per orders S/P liver biopsy, used urinal in bed, bandaid intact to right abdomen.  Currently sitting up at bedside, no distress.

## 2014-10-29 NOTE — Sedation Documentation (Signed)
Bandaid R upper abdomen area intact

## 2014-10-29 NOTE — Procedures (Signed)
US liver lesion core bx 18g x3 to surg path. No complication No blood loss. See complete dictation in [facility].

## 2014-10-29 NOTE — Progress Notes (Signed)
Patient ID: Craig Dean, male   DOB: 1931/08/01, 81 y.o.   MRN: [id number]    Pt scheduled for liver lesion bx today To be at [facility] today via ambulance at 100pm  Will return to [facility] after procedure  LD Lovenox 6/14  RN aware of plan

## 2014-10-30 MED ORDER — ENALAPRIL MALEATE 20 MG PO TABS: 10.0000 mg | ORAL_TABLET | Freq: Every day | ORAL | Status: DC

## 2014-10-30 MED ORDER — CARVEDILOL 6.25 MG PO TABS: 6.2500 mg | ORAL_TABLET | Freq: Two times a day (BID) | ORAL | Status: AC

## 2014-10-30 MED ORDER — FLUOXETINE HCL 10 MG PO CAPS: 10.0000 mg | ORAL_CAPSULE | Freq: Every day | ORAL | Status: AC

## 2014-10-30 MED ORDER — HYDROCODONE-ACETAMINOPHEN 5-325 MG PO TABS: 1.0000 | ORAL_TABLET | ORAL | Status: AC | PRN

## 2014-10-30 NOTE — Discharge Summary (Signed)
Physician Discharge Summary  Craig Dean NWG:[id number] DOB: 09/13/1931 DOA: 10/27/2014  PCP: Redge Gainer, MD  Admit date: 10/27/2014 Discharge date: 10/30/2014  Time spent: 40 minutes  Recommendations for Outpatient Follow-up:  1. Follow up with oncology to discuss biopsy results 2. Follow up with Dr. Percival Spanish in 2 weeks 3. Follow up with primary care physician in 1-2 weeks  Discharge Diagnoses:  Principal Problem:   Abdominal swelling, mass, or lump Active Problems:   HTN (hypertension)   Coronary atherosclerosis   Atrial fibrillation   Chronic systolic congestive heart failure   Arthropathy   History of prostatectomy   Prostate cancer   H/O pneumonectomy   Abdominal pain   Malnutrition of moderate degree   Hepatic metastases   Discharge Condition: stable  Diet recommendation: low salt  Filed Weights   10/27/14 1426  Weight: 74.934 kg (165 lb 3.2 oz)    History of present illness:  This patient was directly admitted to the hospital from Dr. Tawanna Sat office when he was found to have a possibly enlarged liver, slight elevation of liver function tests having increasing dyspnea on exertion.  Hospital Course:  Patient was evaluated in the hospital. Workup for dyspnea on exertion include VQ scan which was low probability for pulmonary embolus. He underwent echocardiogram that showed ejection fraction of 40-45% which is consistent with prior levels. He underwent CT abdomen and pelvis to further evaluate his hepatomegaly which indicated hepatic metastases as well as bony metastases. This is likely the cause of his generalized weakness and dyspnea on exertion. He was seen by oncology who recommended liver biopsy. This was done by interventional radiology. PSA was also checked and noted to be markedly elevated at 55. It was felt that he likely has metastatic prostate cancer, the biopsy results are currently pending. He will follow-up with oncology in the next week to discuss  biopsy results and treatment options.  Patient was noted to have systolic dysfunction with an EF of 40-45% consistent with prior echoes. He has known coronary artery disease. Troponins were mildly elevated at 0.05 and 0.04. He did not have any chest pain or EKG changes. Case was discussed with Dr. Harrington Challenger who felt that it would be appropriate for outpatient cardiology follow-up. Patient has known atrial fibrillation and was taking Cardizem prior to admission. This was discontinued when he was found to have low blood pressures. With his systolic dysfunction, he's been transitioned to carvedilol. He is continued on ACE inhibitor as well as aspirin.  The remainder of his medical issues remained stable. He will need follow-up with oncology to further discuss treatment options for possible prostate cancer as well as cardiology follow-up to discuss his heart failure. He is otherwise stable for discharge.  Procedures:  Liver biopsy  Echo: - Mild LVH with LVEF approximately 40-45%, wall motion abnormalities suggestive of ischemic heart disease. Grade 1 diastolic dysfunction. Mild left atrial enlargement. MAC with mild mitral regurgitation. Severe calcific aortic stenosis as outlined above. Mild aortic regurgitation. Trivial tricuspid regurgitation, unable to assess PASP.  Consultations:  Oncology  Interventional radiology  Discharge Exam: Filed Vitals:   10/30/14 1305  BP: 110/70  Pulse: 87  Temp: 98 F (36.7 C)  Resp: 20    General: NAD Cardiovascular: S1, S2 RRR Respiratory: CTA B  Discharge Instructions   Discharge Instructions    Diet - low sodium heart healthy    Complete by:  As directed      Increase activity slowly    Complete by:  As  directed           Current Discharge Medication List    START taking these medications   Details  carvedilol (COREG) 6.25 MG tablet Take 1 tablet (6.25 mg total) by mouth 2 (two) times daily with a meal. Qty: 60 tablet,  Refills: 0    FLUoxetine (PROZAC) 10 MG capsule Take 1 capsule (10 mg total) by mouth daily. Qty: 30 capsule, Refills: 3    HYDROcodone-acetaminophen (NORCO/VICODIN) 5-325 MG per tablet Take 1-2 tablets by mouth every 4 (four) hours as needed for moderate pain. Qty: 30 tablet, Refills: 0      CONTINUE these medications which have CHANGED   Details  enalapril (VASOTEC) 20 MG tablet Take 0.5 tablets (10 mg total) by mouth daily. Qty: 60 tablet, Refills: 4      CONTINUE these medications which have NOT CHANGED   Details  acetaminophen (TYLENOL ARTHRITIS PAIN) 650 MG CR tablet Take 650 mg by mouth every 8 (eight) hours as needed.      aspirin 81 MG tablet Take 81 mg by mouth daily.        STOP taking these medications     diltiazem (TIAZAC) 300 MG 24 hr capsule      hydrochlorothiazide (MICROZIDE) 12.5 MG capsule        Allergies  Allergen Reactions  . Penicillins   . Statins Other (See Comments)    myalgias   Follow-up Information    Follow up with Molli Hazard, MD.   Specialties:  Hematology and Oncology, Oncology   Why:  call for appointment   Contact information:   [address] [place] [address] [phone]       Follow up with Minus Breeding, MD.   Specialty:  Cardiology   Why:  call for appointment in 2 weeks   Contact information:   [address] [place] [address] [phone]        The results of significant diagnostics from this hospitalization (including imaging, microbiology, ancillary and laboratory) are listed below for reference.    Significant Diagnostic Studies: Dg Chest 2 View  10/27/2014   CLINICAL DATA:  Short of breath, fever, history of prior right pneumonectomy  EXAM: CHEST  2 VIEW  COMPARISON:  Chest x-ray of 02/16/2014  FINDINGS: Opacification of the entire right hemi thorax is due to prior right pneumonectomy with fibrothorax. Mediastinal shift to the right is noted. The left lung is clear. Heart size  is difficult to assess. There are degenerative changes throughout the thoracic spine.  IMPRESSION: Stable changes of right pneumonectomy.  The left lung is clear.   Electronically Signed   By: Ivar Drape M.D.   On: 10/27/2014 11:21   Dg Abd 1 View  10/27/2014   CLINICAL DATA:  Abdominal pain, fever, history of right pneumonectomy  EXAM: ABDOMEN - 1 VIEW  COMPARISON:  CT abdomen pelvis of 12/26/2013 and abdomen plain films of 09/03/2005  FINDINGS: The supine film of the abdomen shows no bowel obstruction. No opaque calculi are seen. 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 There are degenerative changes within the lumbar spine.  IMPRESSION: No bowel obstruction. No opaque calculi. Prior right pneumonectomy.   Electronically Signed   By: Ivar Drape M.D.   On: 10/27/2014 11:20   Ct Abdomen Pelvis W Contrast  10/27/2014   CLINICAL DATA:  Abdominal swelling, palpable mass or lump in epigastric area, history of prostate cancer, RIGHT  pneumonectomy for lung cancer, abnormal LFTs, atrial fibrillation, CHF  EXAM: CT ABDOMEN AND PELVIS WITH CONTRAST  TECHNIQUE: Multidetector CT imaging of the abdomen and pelvis was performed using the standard protocol following bolus administration of intravenous contrast. Sagittal and coronal MPR images reconstructed from axial data set.  CONTRAST:  170m OMNIPAQUE IOHEXOL 300 MG/ML SOLN, 533mOMNIPAQUE IOHEXOL 300 MG/ML SOLN  COMPARISON:  12/26/2013  FINDINGS: Fluid, pleural thickening and pleural calcification at inferior RIGHT hemi thorax post pneumonectomy.  LEFT lung base clear.  Scattered atherosclerotic calcifications aorta, abdominal branch vessels, and coronary arteries.  Numerous poorly defined low-attenuation masses within liver compatible with widespread hepatic metastatic disease.  Largest lesion is at the superior aspect of the LEFT lobe 9.3 x 6.4 cm image 10.  Lateral RIGHT lobe lesion 3.3 x 3.2 cm image 35.   Calcified gallstones within gallbladder.  Spleen, pancreas, kidneys and RIGHT adrenal gland normal.  LEFT adrenal mass 2.1 x 1.8 cm image 19 previously 1.9 x 1.8 cm.  Prostatic enlargement, gland 5.6 x 5.1 cm image 75.  Abnormal soft tissue at the posterior bladder wall lateral to the prostate margins versus previous exam concerning extension of prostate tumor or bladder tumor, new.  Bladder calculus again noted 11 mm diameter.  Stomach and bowel loops grossly unremarkable.  No free air, hernia, or definite adenopathy.  Small amount of perihepatic free intraperitoneal fluid.  Appendix not visualized.  Scattered sclerotic osseous lesions compatible with sclerotic metastases progressive, including spine, pelvis, ribs, and proximal femora.  IMPRESSION: Extensive sclerotic osseous metastatic disease.  Extensive hepatic metastatic disease.  Little change in size of LEFT adrenal nodule.  Cholelithiasis.  Post surgical changes of RIGHT pneumonectomy.   Electronically Signed   By: MaLavonia Dana.D.   On: 10/27/2014 19:39   Nm Pulmonary Perf And Vent  10/28/2014   CLINICAL DATA:  Dyspnea on exertion.  EXAM: NUCLEAR MEDICINE VENTILATION - PERFUSION LUNG SCAN  TECHNIQUE: Ventilation images were obtained in multiple projections using inhaled aerosol Tc-9984mPA. Perfusion images were obtained in multiple projections after intravenous injection of Tc-3m83m.  RADIOPHARMACEUTICALS:  42 mCi Technetium-3m 44m aerosol inhalation and 6.2 mCi Technetium-3m M31mV  COMPARISON:  Chest x-ray from yesterday  FINDINGS: No perfusion defects in the solitary left lung to suggest pulmonary embolism. Changes of mild airway turbulence with central radiotracer deposition on ventilation. Right pneumonectomy.  IMPRESSION: 1. Normal left lung perfusion.  Negative for pulmonary embolism. 2. Right pneumonectomy.   Electronically Signed   By: JonathMonte Fantasia  On: 10/28/2014 09:18   Us Bio[employer]  10/30/2014   CLINICAL DATA:  Multiple liver  lesions suggesting metastatic disease.  EXAM: ULTRASOUND-GUIDED CORE LIVER LESION BIOPSY  TECHNIQUE: An ultrasound guided liver biopsy was thoroughly discussed with the patient and questions were answered. The benefits, risks, alternatives, and complications were also discussed. The patient understands and wishes to proceed with the procedure. A verbal as well as written consent was obtained.  Survey ultrasound of the liver was performed, a representative right lobe lesion localized, and an appropriate skin entry site was determined. Skin site was marked, prepped with chlorhexidine, and draped in usual sterile fashion, and infiltrated locally with 1% lidocaine.  Intravenous Fentanyl and Versed were administered as conscious sedation during continuous cardiorespiratory monitoring by the radiology RN, with a total moderate sedation time of 5 minutes.  A 17 gauge trocar needle was advanced under ultrasound guidance into the liver to the margin of the lesion. 3 coaxial 18gauge  core samples were then obtained through the guide needle. The guide needle was removed. Post procedure scans demonstrate no apparent complication.  COMPLICATIONS: COMPLICATIONS None immediate  FINDINGS: Multiple liver lesions are again confirmed. Ultrasound-guided core biopsy of a representative right lobe lesion obtained without complication.  IMPRESSION: 1. Technically successful ultrasound guided core liver lesion biopsy.   Electronically Signed   By: Lucrezia Europe M.D.   On: 10/30/2014 08:34    Microbiology: Recent Results (from the past 240 hour(s))  Urine culture     Status: None   Collection Time: 10/27/14 10:51 AM  Result Value Ref Range Status   Urine Culture, Routine Final report  Final   Result 1 Comment  Final    Comment: Mixed urogenital flora Less than 10,000 colonies/mL      Labs: Basic Metabolic Panel:  Recent Labs Lab 10/26/14 1624 10/27/14 1647 10/28/14 0404 10/29/14 0556  NA 137 133* 132* 135  K 4.5 4.9 3.9  3.6  CL 93* 95* 93* 95*  CO2 '24 30 28 31  '$ GLUCOSE 122* 121* 115* 155*  BUN 33* 36* 34* 40*  CREATININE 1.40* 1.20 0.93 1.21  CALCIUM 9.5 8.8* 8.5* 8.4*  MG  --   --   --  1.9   Liver Function Tests:  Recent Labs Lab 10/26/14 1624 10/27/14 1647  AST 93* 78*  ALT 37 31  ALKPHOS 274* 220*  BILITOT 1.3* 2.1*  PROT 6.4 6.3*  ALBUMIN  --  3.4*   No results for input(s): LIPASE, AMYLASE in the last 168 hours. No results for input(s): AMMONIA in the last 168 hours. CBC:  Recent Labs Lab 10/26/14 1626 10/27/14 1647 10/28/14 0404 10/29/14 0556  WBC 9.7 9.0 10.7* 8.9  HGB 14.1 13.5 13.4 12.0*  HCT 45.1 41.2 41.1 37.0*  MCV 83.9 84.8 84.4 84.3  PLT  --  142* 138* 138*   Cardiac Enzymes:  Recent Labs Lab 10/27/14 1647 10/27/14 2212 10/28/14 0404  TROPONINI 0.05* 0.05* 0.04*   BNP: BNP (last 3 results) No results for input(s): BNP in the last 8760 hours.  ProBNP (last 3 results) No results for input(s): PROBNP in the last 8760 hours.  CBG: No results for input(s): GLUCAP in the last 168 hours.     Signed:  Mikhai Bienvenue  [facility] 10/30/2014, 4:22 PM

## 2014-10-30 NOTE — Care Management Note (Signed)
Case Management Note  Patient Details  Name: Craig Dean MRN: [id number] Date of Birth: 04/28/1932  Subjective/Objective:                  Pt admitted from home with liver cancer with mets. Pt lives with his wife and will return home at discharge. Pt is independent with ADL's.  Action/Plan: Anticipate discharge today. No CM needs noted.  Expected Discharge Date:  10/29/14               Expected Discharge Plan:  Home/Self Care  In-House Referral:  NA  Discharge planning Services  CM Consult  Post Acute Care Choice:  NA Choice offered to:  NA  DME Arranged:    DME Agency:     HH Arranged:    HH Agency:     Status of Service:  Completed, signed off  Medicare Important Message Given:  Yes Date Medicare IM Given:  10/30/14 Medicare IM give by:  Christinia Gully, RN BSN CM Date Additional Medicare IM Given:    Additional Medicare Important Message give by:     If discussed at [place] of Stay Meetings, dates discussed:    Additional Comments:  Joylene Draft, RN 10/30/2014, 12:44 PM

## 2014-10-30 NOTE — Progress Notes (Signed)
NURSING PROGRESS NOTE  Trea Latner [id number] Discharge Data: 10/30/2014 9:24 PM Attending Provider: No att. providers found JYL:[id number], Elenore Rota, MD   Marden Noble to be D/C'd Home per MD order.    All IV's discontinued and monitored for bleeding.  All belongings returned to patient for patient to take home.   AVS summary and prescriptions reviewed with patient and his spouse. Patient informed of follow up appointments and given handout regarding care after liver biopsy.   Patient left floor via wheelchair, escorted by NT.  Last Documented Vital Signs:  Blood pressure 110/70, pulse 87, temperature 98 F (36.7 C), temperature source Oral, resp. rate 20, height '5\' 10"'$  (1.778 m), weight 74.934 kg (165 lb 3.2 oz), SpO2 98 %.  Cecilie Kicks D

## 2014-11-02 ENCOUNTER — Telehealth: Payer: Self-pay | Admitting: Family Medicine

## 2014-11-02 NOTE — Telephone Encounter (Signed)
Pt given appt with Dr.Moore 6/27 at 11:15.

## 2014-11-03 ENCOUNTER — Encounter (HOSPITAL_COMMUNITY): Payer: Self-pay | Admitting: Hematology & Oncology

## 2014-11-03 ENCOUNTER — Encounter (HOSPITAL_COMMUNITY): Payer: Medicare Other | Attending: Hematology & Oncology | Admitting: Hematology & Oncology

## 2014-11-03 VITALS — BP 102/45 | Temp 98.2°F | Resp 16 | Ht 68.0 in | Wt 168.1 lb

## 2014-11-03 DIAGNOSIS — C801 Malignant (primary) neoplasm, unspecified: Secondary | ICD-10-CM | POA: Insufficient documentation

## 2014-11-03 DIAGNOSIS — C787 Secondary malignant neoplasm of liver and intrahepatic bile duct: Secondary | ICD-10-CM | POA: Insufficient documentation

## 2014-11-03 DIAGNOSIS — Z8546 Personal history of malignant neoplasm of prostate: Secondary | ICD-10-CM | POA: Diagnosis not present

## 2014-11-03 LAB — COMPREHENSIVE METABOLIC PANEL
ALBUMIN: 3.3 g/dL — AB (ref 3.5–5.0)
ALK PHOS: 335 U/L — AB (ref 38–126)
ALT: 41 U/L (ref 17–63)
ANION GAP: 11 (ref 5–15)
AST: 87 U/L — ABNORMAL HIGH (ref 15–41)
BUN: 23 mg/dL — ABNORMAL HIGH (ref 6–20)
CO2: 32 mmol/L (ref 22–32)
CREATININE: 1.02 mg/dL (ref 0.61–1.24)
Calcium: 8.9 mg/dL (ref 8.9–10.3)
Chloride: 97 mmol/L — ABNORMAL LOW (ref 101–111)
GFR calc Af Amer: >60 mL/min (ref >60)
GFR calc non Af Amer: >60 mL/min (ref >60)
Glucose, Bld: 138 mg/dL — ABNORMAL HIGH (ref 65–99)
Potassium: 4.1 mmol/L (ref 3.5–5.1)
Sodium: 140 mmol/L (ref 135–145)
TOTAL PROTEIN: 6.4 g/dL — AB (ref 6.5–8.1)
Total Bilirubin: 1.8 mg/dL — ABNORMAL HIGH (ref 0.3–1.2)

## 2014-11-03 LAB — CBC WITH DIFFERENTIAL/PLATELET
Basophils Absolute: 0 10*3/uL (ref 0.0–0.1)
Basophils Relative: 0 % (ref 0–1)
EOS ABS: 0 10*3/uL (ref 0.0–0.7)
EOS PCT: 0 % (ref 0–5)
HCT: 43.2 % (ref 39.0–52.0)
Hemoglobin: 14 g/dL (ref 13.0–17.0)
LYMPHS ABS: 1.1 10*3/uL (ref 0.7–4.0)
Lymphocytes Relative: 10 % — ABNORMAL LOW (ref 12–46)
MCH: 27.8 pg (ref 26.0–34.0)
MCHC: 32.4 g/dL (ref 30.0–36.0)
MCV: 85.7 fL (ref 78.0–100.0)
MONO ABS: 0.8 10*3/uL (ref 0.1–1.0)
Monocytes Relative: 8 % (ref 3–12)
Neutro Abs: 8.8 10*3/uL — ABNORMAL HIGH (ref 1.7–7.7)
Neutrophils Relative %: 82 % — ABNORMAL HIGH (ref 43–77)
PLATELETS: 184 10*3/uL (ref 150–400)
RBC: 5.04 MIL/uL (ref 4.22–5.81)
RDW: 14.2 % (ref 11.5–15.5)
WBC: 10.8 10*3/uL — ABNORMAL HIGH (ref 4.0–10.5)

## 2014-11-03 NOTE — Progress Notes (Signed)
[place] [facility] [place] Note  Patient Care Team: Chipper Herb, MD as PCP - General (Family Medicine)  CHIEF COMPLAINTS/PURPOSE OF CONSULTATION:  Liver and Bone metastases Liver biopsy 10/29/2014 Small Cell Carcinoma on pathology, Stage IV, extensive stage disease History of NSCLC s/p surgical resection, XRT and chemotherapy at [facility]  HISTORY OF PRESENTING ILLNESS:  Craig Dean 81 y.o. male is here because of newly diagnosed Small Cell Carcinoma with extensive liver metastases. He was admitted to [facility] last week secondary to hepatomegaly, weight loss and pain.  He comes in today with his son and wife to review pathology after a liver biopsy last week.  He has a known history of high grade adenocarcinoma of the prostate. Biopsied and diagnosed last fall, untreated.  In regards to pain it is fairly well controlled. He still is able to do most of his ADLs but there is no additional activity. His appetite is generally poor. Pathology is consistent with small cell carcinoma, currently unknown primary.   MEDICAL HISTORY:  Past Medical History  Diagnosis Date  . HYPERLIPIDEMIA   . HYPERTENSION   . CAD   . Aortic valve disorders   . Atrial fibrillation   . CHF   . Edema   . ITP (idiopathic thrombocytopenic purpura)   . Lung cancer   . Prostate cancer     SURGICAL HISTORY: Past Surgical History  Procedure Laterality Date  . Carpal tunnel release    . Pneumonectomy    . Prostatectomy    . Liver biopsy  10/2014    SOCIAL HISTORY: History   Social History  . Marital Status: Married    Spouse Name: N/A  . Number of Children: N/A  . Years of Education: N/A   Occupational History  . Not on file.   Social History Main Topics  . Smoking status: Former Smoker    Types: Cigarettes    Quit date: 05/15/1982  . Smokeless tobacco: Not on file  . Alcohol Use: No  . Drug Use: No  . Sexual Activity: Not on file   Other Topics Concern  . Not on  file   Social History Narrative    FAMILY HISTORY: Family History  Problem Relation Age of Onset  . Alzheimer's disease Father    indicated that his mother is deceased. He indicated that his father is deceased.   ALLERGIES:  is allergic to penicillins and statins.  MEDICATIONS:  Current Outpatient Prescriptions  Medication Sig Dispense Refill  . acetaminophen (TYLENOL ARTHRITIS PAIN) 650 MG CR tablet Take 650 mg by mouth daily.     Marland Kitchen aspirin 81 MG tablet Take 81 mg by mouth daily.      . carvedilol (COREG) 6.25 MG tablet Take 1 tablet (6.25 mg total) by mouth 2 (two) times daily with a meal. 60 tablet 0  . HYDROcodone-acetaminophen (NORCO/VICODIN) 5-325 MG per tablet Take 1-2 tablets by mouth every 4 (four) hours as needed for moderate pain. (Patient taking differently: Take 1 tablet by mouth 3 (three) times daily. ) 30 tablet 0  . ALPRAZolam (XANAX) 0.25 MG tablet Take 1 tablet (0.25 mg total) by mouth 2 (two) times daily as needed for anxiety. (Patient taking differently: Take 0.25 mg by mouth 2 (two) times daily. ) 60 tablet 1  . FLUoxetine (PROZAC) 10 MG capsule Take 1 capsule (10 mg total) by mouth daily. 30 capsule 3  . levofloxacin (LEVAQUIN) 750 MG tablet Take 1 tablet (750 mg total) by mouth  daily. 7 tablet 0  . morphine (ROXANOL) 20 MG/ML concentrated solution Take 0.5 mLs (10 mg total) by mouth every 2 (two) hours as needed for severe pain. 30 mL 0  . ondansetron (ZOFRAN) 8 MG tablet Take 1 tablet (8 mg total) by mouth every 8 (eight) hours as needed for nausea or vomiting. 30 tablet 2   No current facility-administered medications for this visit.    Review of Systems  Constitutional: Positive for weight loss and malaise/fatigue.  HENT: Negative.   Eyes: Negative.   Respiratory: Positive for shortness of breath.   Cardiovascular: Negative.   Gastrointestinal: Positive for abdominal pain and constipation.  Genitourinary: Positive for frequency.  Musculoskeletal:  Positive for joint pain.  Skin: Negative.   Neurological: Positive for weakness. Negative for dizziness, tingling, tremors, sensory change, speech change, focal weakness, seizures and loss of consciousness.  Endo/Heme/Allergies: Negative.   Psychiatric/Behavioral: Negative.    14 point ROS was done and is otherwise as detailed above or in HPI   PHYSICAL EXAMINATION: ECOG PERFORMANCE STATUS: 2 - Symptomatic, <50% confined to bed  Filed Vitals:   11/03/14 1622  BP: 102/45  Temp: 98.2 F (36.8 C)  Resp: 16   Filed Weights   11/03/14 1622  Weight: 168 lb 1.6 oz (76.25 kg)     Physical Exam  Constitutional: He is oriented to person, place, and time and well-developed, well-nourished, and in no distress.  HENT:  Head: Normocephalic and atraumatic.  Nose: Nose normal.  Mouth/Throat: Oropharynx is clear and moist. No oropharyngeal exudate.  Eyes: Conjunctivae and EOM are normal. Pupils are equal, round, and reactive to light. Right eye exhibits no discharge. Left eye exhibits no discharge. No scleral icterus.  Neck: Normal range of motion. Neck supple. No tracheal deviation present. No thyromegaly present.  Cardiovascular: Normal rate, regular rhythm and normal heart sounds.  Exam reveals no gallop and no friction rub.   No murmur heard. Pulmonary/Chest: Effort normal and breath sounds normal. He has no wheezes. He has no rales.  Chest wall deformity from prior surgery  Abdominal: Soft. Bowel sounds are normal. He exhibits distension. He exhibits no mass. There is tenderness. There is no rebound and no guarding.  hepatomegaly  Musculoskeletal: Normal range of motion. He exhibits no edema.  Lymphadenopathy:    He has no cervical adenopathy.  Neurological: He is alert and oriented to person, place, and time. He has normal reflexes. No cranial nerve deficit. Gait normal. Coordination normal.  Skin: Skin is warm and dry. No rash noted.  Psychiatric: Mood, memory, affect and judgment  normal.  Nursing note and vitals reviewed.     LABORATORY DATA:  I have reviewed the data as listed  Results for PARVIN, STETZER (MRN [id number]) as of 11/18/2014 18:07  Ref. Range 11/03/2014 17:18  Sodium Latest Ref Range: 135-145 mmol/L 140  Potassium Latest Ref Range: 3.5-5.1 mmol/L 4.1  Chloride Latest Ref Range: 101-111 mmol/L 97 (L)  CO2 Latest Ref Range: 22-32 mmol/L 32  BUN Latest Ref Range: 6-20 mg/dL 23 (H)  Creatinine Latest Ref Range: 0.61-1.24 mg/dL 1.02  Calcium Latest Ref Range: 8.9-10.3 mg/dL 8.9  EGFR (Non-African Amer.) Latest Ref Range: >60 mL/min >60  EGFR (African American) Latest Ref Range: >60 mL/min >60  Glucose Latest Ref Range: 65-99 mg/dL 138 (H)  Anion gap Latest Ref Range: 5-15  11  Alkaline Phosphatase Latest Ref Range: 38-126 U/L 335 (H)  Albumin Latest Ref Range: 3.5-5.0 g/dL 3.3 (L)  AST Latest Ref Range: 15-41  U/L 87 (H)  ALT Latest Ref Range: 17-63 U/L 41  Total Protein Latest Ref Range: 6.5-8.1 g/dL 6.4 (L)  Total Bilirubin Latest Ref Range: 0.3-1.2 mg/dL 1.8 (H)  WBC Latest Ref Range: 4.0-10.5 K/uL 10.8 (H)  RBC Latest Ref Range: 4.22-5.81 MIL/uL 5.04  Hemoglobin Latest Ref Range: 13.0-17.0 g/dL 14.0  HCT Latest Ref Range: 39.0-52.0 % 43.2  MCV Latest Ref Range: 78.0-100.0 fL 85.7  MCH Latest Ref Range: 26.0-34.0 pg 27.8  MCHC Latest Ref Range: 30.0-36.0 g/dL 32.4  RDW Latest Ref Range: 11.5-15.5 % 14.2  Platelets Latest Ref Range: 150-400 K/uL 184  Neutrophils Latest Ref Range: 43-77 % 82 (H)  Lymphocytes Latest Ref Range: 12-46 % 10 (L)  Monocytes Relative Latest Ref Range: 3-12 % 8  Eosinophil Latest Ref Range: 0-5 % 0  Basophil Latest Ref Range: 0-1 % 0  NEUT# Latest Ref Range: 1.7-7.7 K/uL 8.8 (H)  Lymphocyte # Latest Ref Range: 0.7-4.0 K/uL 1.1  Monocyte # Latest Ref Range: 0.1-1.0 K/uL 0.8  Eosinophils Absolute Latest Ref Range: 0.0-0.7 K/uL 0.0  Basophils Absolute Latest Ref Range: 0.0-0.1 K/uL 0.0       RADIOGRAPHIC  STUDIES: I have personally reviewed the radiological images as listed and agreed with the findings in the report.   CLINICAL DATA: Abdominal swelling, palpable mass or lump in epigastric area, history of prostate cancer, RIGHT pneumonectomy for lung cancer, abnormal LFTs, atrial fibrillation, CHF  EXAM: CT ABDOMEN AND PELVIS WITH CONTRAST  TECHNIQUE: Multidetector CT imaging of the abdomen and pelvis was performed using the standard protocol following bolus administration of intravenous contrast. Sagittal and coronal MPR images reconstructed from axial data set.  CONTRAST: 130mL OMNIPAQUE IOHEXOL 300 MG/ML SOLN, 27mL OMNIPAQUE IOHEXOL 300 MG/ML SOLN  COMPARISON: 12/26/2013  FINDINGS: Fluid, pleural thickening and pleural calcification at inferior RIGHT hemi thorax post pneumonectomy.  LEFT lung base clear.  Scattered atherosclerotic calcifications aorta, abdominal branch vessels, and coronary arteries.  Numerous poorly defined low-attenuation masses within liver compatible with widespread hepatic metastatic disease.  Largest lesion is at the superior aspect of the LEFT lobe 9.3 x 6.4 cm image 10.  Lateral RIGHT lobe lesion 3.3 x 3.2 cm image 35.  Calcified gallstones within gallbladder.  Spleen, pancreas, kidneys and RIGHT adrenal gland normal.  LEFT adrenal mass 2.1 x 1.8 cm image 19 previously 1.9 x 1.8 cm.  Prostatic enlargement, gland 5.6 x 5.1 cm image 75.  Abnormal soft tissue at the posterior bladder wall lateral to the prostate margins versus previous exam concerning extension of prostate tumor or bladder tumor, new.  Bladder calculus again noted 11 mm diameter.  Stomach and bowel loops grossly unremarkable.  No free air, hernia, or definite adenopathy.  Small amount of perihepatic free intraperitoneal fluid.  Appendix not visualized.  Scattered sclerotic osseous lesions compatible with sclerotic metastases progressive,  including spine, pelvis, ribs, and proximal femora.  IMPRESSION: Extensive sclerotic osseous metastatic disease.  Extensive hepatic metastatic disease.  Little change in size of LEFT adrenal nodule.  Cholelithiasis.  Post surgical changes of RIGHT pneumonectomy.   Electronically Signed  By: Lavonia Dana M.D.  On: 10/27/2014 19:39      FINAL DIAGNOSIS Diagnosis Liver, needle/core biopsy - HIGH GRADE CARCINOMA WITH NEUROENDOCRINE FEATURES, MOST SUGGESTIVE OF SMALL CELL CARCINOMA. - LYMPHOVASCULAR INVASION IS IDENTIFIED. - SEE COMMENT. Microscopic Comment To help phenotype the tumor, immunohistochemical stains were performed. The malignant cells are positive for CD56. They are negative for synaptophysin, chromogranin, PSA, prostein, TTF, cytokeratin 7, and cytokeratin  20. Given the histologic features as well as the strong CD56 staining, the findings are most consistent with small cell carcinoma. Of note, the current tumor is morphologically dissimilar from the tumor seen in the patient's previous prostate biopsies. Dr. Vicente Males has reviewed the case and is in essential agreement with this interpretation. The case was discussed with Dr. Whitney Muse on 11/03/2014. (JBK:ecj 11/02/2014) Enid Cutter MD Pathologist, Electronic Signature (Case signed 11/03/2014)    ASSESSMENT & PLAN:  Small Cell Carcinoma on pathology, Stage IV, extensive stage disease History of NSCLC s/p surgical resection, XRT and chemotherapy at [facility] History of high grade adenocarcinoma of prostate   We discussed his disease and prognosis. I explained that without treatment survival is measured in months. With treatment survival could extend out to a year. In regards to the primary site of disease, at this point I am uncertain. We can obtain a CT of the chest for further delineation. It is also certainly possible that this is small cell of the prostate, but I explained to the patient and his  family that this would not change his prognosis or therapy.  We discussed the risks and benefits of treatment. He is still a reasonable performance status and therefore I feel he is a candidate for therapy. In addition this is small cell carcinoma and I explained to them that any times patients with small cell responded to therapy and improve her strength and physical status.  We will plan on beginning therapy tomorrow. He does have an element of liver dysfunction and I would not wait any longer to begin therapy. I advised the patient and his family his first cycle will be his most difficult. We are here in order to provide good supportive care. We will plan on seeing him back one week post cycle 1 with repeat laboratory studies and to assess tolerance. He will need an MRI of the brain as well and we can order this at his next f/u.   Orders Placed This Encounter  Procedures  . CBC with Differential  . Comprehensive metabolic panel   All questions were answered. The patient knows to call the clinic with any problems, questions or concerns.  This document serves as a record of services personally performed by Ancil Linsey, MD. It was created on her behalf by Arlyce Harman, a trained medical scribe. The creation of this record is based on the scribe's personal observations and the provider's statements to them. This document has been checked and approved by the attending provider.  I have reviewed the above documentation for accuracy and completeness, and I agree with the above.  This note was electronically signed.  Molli Hazard, MD

## 2014-11-03 NOTE — Patient Instructions (Addendum)
[place] at [facility] Discharge Instructions  RECOMMENDATIONS MADE BY THE CONSULTANT AND ANY TEST RESULTS WILL BE SENT TO YOUR REFERRING PHYSICIAN.   If you decide to do chemo these drugs will be given and we will start tomorrow November 04, 2014 Craig Dean will meet you here at 8:30 to do teaching with you about your chemotherapy drugs.   We are going to do blood work today so that you dont have to wait tomorrow.  Premeds: Premeds: Zofran - for nausea/vomiting prevention/reduction. Dexamethasone - steroid - given to reduce the risk of you having an allergic type reaction to the chemotherapy. Dex can cause you to feel energized, nervous/anxious/jittery, make you have trouble sleeping, and/or make you feel hot/flushed in the face/neck and/or look pink/red in the face/neck. These side effects will pass as the Dex wears off.  (takes 30 minutes to infuse)  Carboplatin - this medication can be hard on your kidneys - this is why we need you to drink 64 oz of fluid (preferably water/decaff fluids) 2 days prior to chemo and for up to 4-5 days after chemo. Drink more if you can. This will help to keep your kidneys flushed. This can cause mild hair loss, lower your platelets (which make your blood clot), lower your white blood cells (fight infection), and cause nausea/vomiting. (takes 30 minutes to infuse) You will receive this on Day 1 every 21 days   VP16 (Etoposide) - this medication can lower your blood pressure. We will monitor this during chemo. Bone marrow suppression (lowers white blood cells (fight infection), lowers red blood cells (make up your blood), lowers platelets (help blood to clot). Hair loss, loss of appetite. (tkes 1 hour to infuse) You will receive this on Days 1,2,3 every 21 days   Neulasta - this medication is not chemo but being given because you have had chemo. It is usually given 24 hours after the completion of chemotherapy. This medication works by boosting your  bone marrow's supply of white blood cells. White blood cells are what protect our bodies against infection. The medication is given in the form of a subcutaneous injection. It is given in the fatty tissue of your abdomen. It is a short needle. The major side effect of this medication is bone or muscle pain. The drug of choice to relieve or lessen the pain is Aleve or Ibuprofen. If a physician has ever told you not to take Aleve or Ibuprofen - then don't take it. You should then take Tylenol/acetaminophen. Take either medication as the bottle directs you to.  The level of pain you experience as a result of this injection can range from none, to mild or moderate, or severe. Please let us know if you develop moderate or severe bone pain.       Thank you for choosing [place] at [facility] to provide your oncology and hematology care.  To afford each patient quality time with our provider, please arrive at least 15 minutes before your scheduled appointment time.    You need to re-schedule your appointment should you arrive 10 or more minutes late.  We strive to give you quality time with our providers, and arriving late affects you and other patients whose appointments are after yours.  Also, if you no show three or more times for appointments you may be dismissed from the clinic at the providers discretion.     Again, thank you for choosing [facility].  Our hope is that these requests will decrease the amount of time that you wait before being seen by our physicians.       _____________________________________________________________  Should you have questions after your visit to [facility], please contact our office at [phone] between the hours of 8:30 a.m. and 4:30 p.m.  Voicemails left after 4:30 p.m. will not be returned until the following business day.  For prescription refill requests, have your pharmacy contact our office.

## 2014-11-04 ENCOUNTER — Encounter (HOSPITAL_BASED_OUTPATIENT_CLINIC_OR_DEPARTMENT_OTHER): Payer: Medicare Other

## 2014-11-04 ENCOUNTER — Other Ambulatory Visit (HOSPITAL_COMMUNITY): Payer: Self-pay | Admitting: Oncology

## 2014-11-04 ENCOUNTER — Encounter: Payer: Self-pay | Admitting: *Deleted

## 2014-11-04 VITALS — BP 127/69 | HR 79 | Temp 97.7°F | Resp 18 | Wt 168.0 lb

## 2014-11-04 DIAGNOSIS — C787 Secondary malignant neoplasm of liver and intrahepatic bile duct: Secondary | ICD-10-CM | POA: Diagnosis not present

## 2014-11-04 DIAGNOSIS — C801 Malignant (primary) neoplasm, unspecified: Secondary | ICD-10-CM | POA: Diagnosis not present

## 2014-11-04 DIAGNOSIS — Z5111 Encounter for antineoplastic chemotherapy: Secondary | ICD-10-CM | POA: Diagnosis not present

## 2014-11-04 DIAGNOSIS — R160 Hepatomegaly, not elsewhere classified: Secondary | ICD-10-CM

## 2014-11-04 MED ORDER — SODIUM CHLORIDE 0.9 % IJ SOLN: 10.0000 mL | INTRAMUSCULAR | Status: DC | PRN

## 2014-11-04 MED ORDER — PROCHLORPERAZINE MALEATE 10 MG PO TABS: 10.0000 mg | ORAL_TABLET | Freq: Four times a day (QID) | ORAL | Status: DC | PRN

## 2014-11-04 MED ORDER — CARBOPLATIN CHEMO INJECTION 600 MG/60ML
436.5000 mg | Freq: Once | INTRAVENOUS | Status: AC
  Administered 2014-11-04: 440 mg via INTRAVENOUS
  Filled 2014-11-04: qty 44

## 2014-11-04 MED ORDER — SODIUM CHLORIDE 0.9 % IV SOLN
Freq: Once | INTRAVENOUS | Status: AC
  Administered 2014-11-04: 09:00:00 via INTRAVENOUS

## 2014-11-04 MED ORDER — SODIUM CHLORIDE 0.9 % IV SOLN
Freq: Once | INTRAVENOUS | Status: AC
  Administered 2014-11-04: 10:00:00 via INTRAVENOUS
  Filled 2014-11-04: qty 8

## 2014-11-04 MED ORDER — ONDANSETRON HCL 8 MG PO TABS: 8.0000 mg | ORAL_TABLET | Freq: Three times a day (TID) | ORAL | Status: AC | PRN

## 2014-11-04 MED ORDER — SODIUM CHLORIDE 0.9 % IV SOLN
100.0000 mg/m2 | Freq: Once | INTRAVENOUS | Status: AC
  Administered 2014-11-04: 190 mg via INTRAVENOUS
  Filled 2014-11-04: qty 9.5

## 2014-11-04 NOTE — Patient Instructions (Addendum)
[place]   CHEMOTHERAPY INSTRUCTIONS  Premeds: Premeds: Zofran - for nausea/vomiting prevention/reduction. Dexamethasone - steroid - given to reduce the risk of you having an allergic type reaction to the chemotherapy. Dex can cause you to feel energized, nervous/anxious/jittery, make you have trouble sleeping, and/or make you feel hot/flushed in the face/neck and/or look pink/red in the face/neck. These side effects will pass as the Dex wears off.  (takes 30 minutes to infuse)  Carboplatin - this medication can be hard on your kidneys - this is why we need you to drink 64 oz of fluid (preferably water/decaff fluids) 2 days prior to chemo and for up to 4-5 days after chemo. Drink more if you can. This will help to keep your kidneys flushed. This can cause mild hair loss, lower your platelets (which make your blood clot), lower your white blood cells (fight infection), and cause nausea/vomiting. (takes 30 minutes to infuse) You will receive this on Day 1 every 21 days   VP16 (Etoposide) - this medication can lower your blood pressure. We will monitor this during chemo. Bone marrow suppression (lowers white blood cells (fight infection), lowers red blood cells (make up your blood), lowers platelets (help blood to clot). Hair loss, loss of appetite. (takes 1 hour to infuse) You will receive this on Days 1,2,3 every 21 days   Neulasta - this medication is not chemo but being given because you have had chemo. It is usually given 24 hours after the completion of chemotherapy. This medication works by boosting your bone marrow's supply of white blood cells. White blood cells are what protect our bodies against infection. The medication is given in the form of a subcutaneous injection. It is given in the fatty tissue of your abdomen. It is a short needle. The major side effect of this medication is bone or muscle pain. The drug of choice to relieve or lessen the pain is  Aleve or Ibuprofen. If a physician has ever told you not to take Aleve or Ibuprofen - then don't take it. You should then take Tylenol/acetaminophen. Take either medication as the bottle directs you to.  The level of pain you experience as a result of this injection can range from none, to mild or moderate, or severe. Please let us know if you develop moderate or severe bone pain.  We will apply the Neulasta on body injector on your arm on Friday (the last day of chemo). It will stay on your arm for 27 hours. After the medication injects @ 27 hours you can remove the on body injector and throw away.    POTENTIAL SIDE EFFECTS OF TREATMENT: Increased Susceptibility to Infection, Vomiting, Constipation, Hair Thinning, Changes in Character of Skin and Nails (brittleness, dryness,etc.), Bone Marrow Suppression, Complete Hair Loss, Nausea, Diarrhea and Mouth Sores   SELF IMAGE NEEDS: Obtain hair accessories as soon as possible (caps,etc.)   EDUCATIONAL MATERIALS GIVEN AND REVIEWED: Chemotherapy and You  Specific Instructions Sheets: Carboplatin, Etoposide, Neulasta, Dexamethasone, Zofran, Compazine   SELF CARE ACTIVITIES WHILE ON CHEMOTHERAPY: Increase your fluid intake 48 hours prior to treatment and drink at least 2 quarts per day after treatment., No alcohol intake., No aspirin or other medications unless approved by your oncologist., Eat foods that are light and easy to digest., Eat foods at cold or room temperature., No fried, fatty, or spicy foods immediately before or after treatment., Have teeth cleaned professionally before starting treatment. Keep dentures and partial plates clean., Use  soft toothbrush and do not use mouthwashes that contain alcohol. Biotene is a good mouthwash that is available at most pharmacies or may be ordered by calling [phone]., Use warm salt water gargles (1 teaspoon salt per 1 quart warm water) before and after meals and at bedtime. Or you may rinse with 2  tablespoons of three -percent hydrogen peroxide mixed in eight ounces of water., Always use sunscreen with SPF (Sun Protection Factor) of 30 or higher., Use your nausea medication as directed to prevent nausea., Use your stool softener or laxative as directed to prevent constipation. and Use your anti-diarrheal medication as directed to stop diarrhea.  Please wash your hands for at least 30 seconds using warm soapy water. Handwashing is the #1 way to prevent the spread of germs. Stay away from sick people or people who are getting over a cold. If you develop respiratory systems such as green/yellow mucus production or productive cough or persistent cough let us know and we will see if you need an antibiotic. It is a good idea to keep a pair of gloves on when going into grocery stores/[employer] to decrease your risk of coming into contact with germs on the carts, etc. Carry alcohol hand gel with you at all times and use it frequently if out in public. All foods need to be cooked thoroughly. No raw foods. No medium or undercooked meats, eggs. If your food is cooked medium well, it does not need to be hot pink or saturated with bloody liquid at all. Vegetables and fruits need to be washed/rinsed under the faucet with a dish detergent before being consumed. You can eat raw fruits and vegetables unless we tell you otherwise but it would be best if you cooked them or bought frozen. Do not eat off of salad bars or hot bars unless you really trust the cleanliness of the restaurant. If you need dental work, please let Dr. Whitney Muse know before you go for your appointment so that we can coordinate the best possible time for you in regards to your chemo regimen. You need to also let your dentist know that you are actively taking chemo. We may need to do labs prior to your dental appointment. We also want your bowels moving at least every other day. If this is not happening, we need to know so that we can get you on a bowel  regimen to help you go.      MEDICATIONS: You have been given prescriptions for the following medications:  Zofran '8mg'$  tablet. Take 1 tablet every 8 hours as needed for nausea/vomiting. (#1 nausea med to take, this can constipate)  Compazine '10mg'$  tablet. Take 1 tablet every 6 hours as needed for nausea/vomiting. (#2 nausea med to take, this can make you sleepy)  Over-the-Counter Meds:  Miralax 1 capful in 8 oz of fluid daily. May increase to two times a day if needed. This is a stool softener. If this doesn't work proceed you can add:  Senokot S  - start with 1 tablet two times a day and increase to 4 tablets two times a day if needed. (total of 8 tablets in a 24 hour period). This is a stimulant laxative.   Call us if this does not help your bowels move.   Imodium '2mg'$  capsule. Take 2 capsules after the 1st loose stool and then 1 capsule every 2 hours until you go a total of 12 hours without having a loose stool. Call the [facility] if loose  stools continue.  SYMPTOMS TO REPORT AS SOON AS POSSIBLE AFTER TREATMENT:  FEVER GREATER THAN 100.5 F  CHILLS WITH OR WITHOUT FEVER  NAUSEA AND VOMITING THAT IS NOT CONTROLLED WITH YOUR NAUSEA MEDICATION  UNUSUAL SHORTNESS OF BREATH  UNUSUAL BRUISING OR BLEEDING  TENDERNESS IN MOUTH AND THROAT WITH OR WITHOUT PRESENCE OF ULCERS  URINARY PROBLEMS  BOWEL PROBLEMS  UNUSUAL RASH    Wear comfortable clothing and clothing appropriate for easy access to any Portacath or PICC line. Let us know if there is anything that we can do to make your therapy better!      I have been informed and understand all of the instructions given to me and have received a copy. I have been instructed to call the clinic [phone] or my family physician as soon as possible for continued medical care, if indicated. I do not have any more questions at this time but understand that I may call the [facility] or the Patient Navigator at [phone]  during office hours should I have questions or need assistance in obtaining follow-up care.           Ondansetron injection What is this medicine? ONDANSETRON (on DAN se tron) is used to treat nausea and vomiting caused by chemotherapy. It is also used to prevent or treat nausea and vomiting after surgery. This medicine may be used for other purposes; ask your health care provider or pharmacist if you have questions. COMMON BRAND NAME(S): Zofran What should I tell my health care provider before I take this medicine? They need to know if you have any of these conditions: -heart disease -history of irregular heartbeat -liver disease -low levels of magnesium or potassium in the blood -an unusual or allergic reaction to ondansetron, granisetron, other medicines, foods, dyes, or preservatives -pregnant or trying to get pregnant -breast-feeding How should I use this medicine? This medicine is for infusion into a vein. It is given by a health care professional in a hospital or clinic setting. Talk to your pediatrician regarding the use of this medicine in children. Special care may be needed. Overdosage: If you think you have taken too much of this medicine contact a poison control center or emergency room at once. NOTE: This medicine is only for you. Do not share this medicine with others. What if I miss a dose? This does not apply. What may interact with this medicine? Do not take this medicine with any of the following medications: -apomorphine -certain medicines for fungal infections like fluconazole, itraconazole, ketoconazole, posaconazole, voriconazole -cisapride -dofetilide -dronedarone -pimozide -thioridazine -ziprasidone This medicine may also interact with the following medications: -carbamazepine -certain medicines for depression, anxiety, or psychotic disturbances -fentanyl -linezolid -MAOIs like Carbex, Eldepryl, Marplan, Nardil, and Parnate -methylene blue  (injected into a vein) -other medicines that prolong the QT interval (cause an abnormal heart rhythm) -phenytoin -rifampicin -tramadol This list may not describe all possible interactions. Give your health care provider a list of all the medicines, herbs, non-prescription drugs, or dietary supplements you use. Also tell them if you smoke, drink alcohol, or use illegal drugs. Some items may interact with your medicine. What should I watch for while using this medicine? Your condition will be monitored carefully while you are receiving this medicine. What side effects may I notice from receiving this medicine? Side effects that you should report to your doctor or health care professional as soon as possible: -allergic reactions like skin rash, itching or hives, swelling of the face,  lips, or tongue -breathing problems -confusion -dizziness -fast or irregular heartbeat -feeling faint or lightheaded, falls -fever and chills -loss of balance or coordination -seizures -sweating -swelling of the hands and feet -tightness in the chest -tremors -unusually weak or tired Side effects that usually do not require medical attention (report to your doctor or health care professional if they continue or are bothersome): -constipation or diarrhea -headache This list may not describe all possible side effects. Call your doctor for medical advice about side effects. You may report side effects to FDA at 1-800-FDA-1088. Where should I keep my medicine? This drug is given in a hospital or clinic and will not be stored at home. NOTE: This sheet is a summary. It may not cover all possible information. If you have questions about this medicine, talk to your doctor, pharmacist, or health care provider.  2015, [employer] Standard. (2013-02-05 16:18:28) Dexamethasone injection What is this medicine? DEXAMETHASONE (dex a METH a sone) is a corticosteroid. It is used to treat inflammation of the skin,  joints, lungs, and other organs. Common conditions treated include asthma, allergies, and arthritis. It is also used for other conditions, like blood disorders and diseases of the adrenal glands. This medicine may be used for other purposes; ask your health care provider or pharmacist if you have questions. COMMON BRAND NAME(S): Decadron, Solurex What should I tell my health care provider before I take this medicine? They need to know if you have any of these conditions: -blood clotting problems -Cushing's syndrome -diabetes -glaucoma -heart problems or disease -high blood pressure -infection like herpes, measles, tuberculosis, or chickenpox -kidney disease -liver disease -mental problems -myasthenia gravis -osteoporosis -previous heart attack -seizures -stomach, ulcer or intestine disease including colitis and diverticulitis -thyroid problem -an unusual or allergic reaction to dexamethasone, corticosteroids, other medicines, lactose, foods, dyes, or preservatives -pregnant or trying to get pregnant -breast-feeding How should I use this medicine? This medicine is for injection into a muscle, joint, lesion, soft tissue, or vein. It is given by a health care professional in a hospital or clinic setting. Talk to your pediatrician regarding the use of this medicine in children. Special care may be needed. Overdosage: If you think you have taken too much of this medicine contact a poison control center or emergency room at once. NOTE: This medicine is only for you. Do not share this medicine with others. What if I miss a dose? This may not apply. If you are having a series of injections over a prolonged period, try not to miss an appointment. Call your doctor or health care professional to reschedule if you are unable to keep an appointment. What may interact with this medicine? Do not take this medicine with any of the following medications: -mifepristone, RU-486 -vaccines This  medicine may also interact with the following medications: -amphotericin B -antibiotics like clarithromycin, erythromycin, and troleandomycin -aspirin and aspirin-like drugs -barbiturates like phenobarbital -carbamazepine -cholestyramine -cholinesterase inhibitors like donepezil, galantamine, rivastigmine, and tacrine -cyclosporine -digoxin -diuretics -ephedrine -male hormones, like estrogens or progestins and birth control pills -indinavir -isoniazid -ketoconazole -medicines for diabetes -medicines that improve muscle tone or strength for conditions like myasthenia gravis -NSAIDs, medicines for pain and inflammation, like ibuprofen or naproxen -phenytoin -rifampin -thalidomide -warfarin This list may not describe all possible interactions. Give your health care provider a list of all the medicines, herbs, non-prescription drugs, or dietary supplements you use. Also tell them if you smoke, drink alcohol, or use illegal drugs. Some items may interact with your medicine.  What should I watch for while using this medicine? Your condition will be monitored carefully while you are receiving this medicine. If you are taking this medicine for a long time, carry an identification card with your name and address, the type and dose of your medicine, and your doctor's name and address. This medicine may increase your risk of getting an infection. Stay away from people who are sick. Tell your doctor or health care professional if you are around anyone with measles or chickenpox. Talk to your health care provider before you get any vaccines that you take this medicine. If you are going to have surgery, tell your doctor or health care professional that you have taken this medicine within the last twelve months. Ask your doctor or health care professional about your diet. You may need to lower the amount of salt you eat. The medicine can increase your blood sugar. If you are a diabetic check with  your doctor if you need help adjusting the dose of your diabetic medicine. What side effects may I notice from receiving this medicine? Side effects that you should report to your doctor or health care professional as soon as possible: -allergic reactions like skin rash, itching or hives, swelling of the face, lips, or tongue -black or tarry stools -change in the amount of urine -changes in vision -confusion, excitement, restlessness, a false sense of well-being -fever, sore throat, sneezing, cough, or other signs of infection, wounds that will not heal -hallucinations -increased thirst -mental depression, mood swings, mistaken feelings of self importance or of being mistreated -pain in hips, back, ribs, arms, shoulders, or legs -pain, redness, or irritation at the injection site -redness, blistering, peeling or loosening of the skin, including inside the mouth -rounding out of face -swelling of feet or lower legs -unusual bleeding or bruising -unusual tired or weak -wounds that do not heal Side effects that usually do not require medical attention (report to your doctor or health care professional if they continue or are bothersome): -diarrhea or constipation -change in taste -headache -nausea, vomiting -skin problems, acne, thin and shiny skin -touble sleeping -unusual growth of hair on the face or body -weight gain This list may not describe all possible side effects. Call your doctor for medical advice about side effects. You may report side effects to FDA at 1-800-FDA-1088. Where should I keep my medicine? This drug is given in a hospital or clinic and will not be stored at home. NOTE: This sheet is a summary. It may not cover all possible information. If you have questions about this medicine, talk to your doctor, pharmacist, or health care provider.  2015, [employer]vier/Gold Standard. (2007-08-22 14:04:12) Carboplatin injection What is this medicine? CARBOPLATIN (KAR boe pla  tin) is a chemotherapy drug. It targets fast dividing cells, like cancer cells, and causes these cells to die. This medicine is used to treat ovarian cancer and many other cancers. This medicine may be used for other purposes; ask your health care provider or pharmacist if you have questions. COMMON BRAND NAME(S): Paraplatin What should I tell my health care provider before I take this medicine? They need to know if you have any of these conditions: -blood disorders -hearing problems -kidney disease -recent or ongoing radiation therapy -an unusual or allergic reaction to carboplatin, cisplatin, other chemotherapy, other medicines, foods, dyes, or preservatives -pregnant or trying to get pregnant -breast-feeding How should I use this medicine? This drug is usually given as an infusion into a vein. It is  administered in a hospital or clinic by a specially trained health care professional. Talk to your pediatrician regarding the use of this medicine in children. Special care may be needed. Overdosage: If you think you have taken too much of this medicine contact a poison control center or emergency room at once. NOTE: This medicine is only for you. Do not share this medicine with others. What if I miss a dose? It is important not to miss a dose. Call your doctor or health care professional if you are unable to keep an appointment. What may interact with this medicine? -medicines for seizures -medicines to increase blood counts like filgrastim, pegfilgrastim, sargramostim -some antibiotics like amikacin, gentamicin, neomycin, streptomycin, tobramycin -vaccines Talk to your doctor or health care professional before taking any of these medicines: -acetaminophen -aspirin -ibuprofen -ketoprofen -naproxen This list may not describe all possible interactions. Give your health care provider a list of all the medicines, herbs, non-prescription drugs, or dietary supplements you use. Also tell them  if you smoke, drink alcohol, or use illegal drugs. Some items may interact with your medicine. What should I watch for while using this medicine? Your condition will be monitored carefully while you are receiving this medicine. You will need important blood work done while you are taking this medicine. This drug may make you feel generally unwell. This is not uncommon, as chemotherapy can affect healthy cells as well as cancer cells. Report any side effects. Continue your course of treatment even though you feel ill unless your doctor tells you to stop. In some cases, you may be given additional medicines to help with side effects. Follow all directions for their use. Call your doctor or health care professional for advice if you get a fever, chills or sore throat, or other symptoms of a cold or flu. Do not treat yourself. This drug decreases your body's ability to fight infections. Try to avoid being around people who are sick. This medicine may increase your risk to bruise or bleed. Call your doctor or health care professional if you notice any unusual bleeding. Be careful brushing and flossing your teeth or using a toothpick because you may get an infection or bleed more easily. If you have any dental work done, tell your dentist you are receiving this medicine. Avoid taking products that contain aspirin, acetaminophen, ibuprofen, naproxen, or ketoprofen unless instructed by your doctor. These medicines may hide a fever. Do not become pregnant while taking this medicine. Women should inform their doctor if they wish to become pregnant or think they might be pregnant. There is a potential for serious side effects to an unborn child. Talk to your health care professional or pharmacist for more information. Do not breast-feed an infant while taking this medicine. What side effects may I notice from receiving this medicine? Side effects that you should report to your doctor or health care professional as  soon as possible: -allergic reactions like skin rash, itching or hives, swelling of the face, lips, or tongue -signs of infection - fever or chills, cough, sore throat, pain or difficulty passing urine -signs of decreased platelets or bleeding - bruising, pinpoint red spots on the skin, black, tarry stools, nosebleeds -signs of decreased red blood cells - unusually weak or tired, fainting spells, lightheadedness -breathing problems -changes in hearing -changes in vision -chest pain -high blood pressure -low blood counts - This drug may decrease the number of white blood cells, red blood cells and platelets. You may be at increased risk  for infections and bleeding. -nausea and vomiting -pain, swelling, redness or irritation at the injection site -pain, tingling, numbness in the hands or feet -problems with balance, talking, walking -trouble passing urine or change in the amount of urine Side effects that usually do not require medical attention (report to your doctor or health care professional if they continue or are bothersome): -hair loss -loss of appetite -metallic taste in the mouth or changes in taste This list may not describe all possible side effects. Call your doctor for medical advice about side effects. You may report side effects to FDA at 1-800-FDA-1088. Where should I keep my medicine? This drug is given in a hospital or clinic and will not be stored at home. NOTE: This sheet is a summary. It may not cover all possible information. If you have questions about this medicine, talk to your doctor, pharmacist, or health care provider.  2015, [employer]/Gold Standard. (2007-08-06 14:38:05) Etoposide, VP-16 injection What is this medicine? ETOPOSIDE, VP-16 (e toe POE side) is a chemotherapy drug. It is used to treat testicular cancer, lung cancer, and other cancers. This medicine may be used for other purposes; ask your health care provider or pharmacist if you have  questions. COMMON BRAND NAME(S): Etopophos, Toposar, VePesid What should I tell my health care provider before I take this medicine? They need to know if you have any of these conditions: -infection -kidney disease -low blood counts, like low white cell, platelet, or red cell counts -an unusual or allergic reaction to etoposide, other chemotherapeutic agents, other medicines, foods, dyes, or preservatives -pregnant or trying to get pregnant -breast-feeding How should I use this medicine? This medicine is for infusion into a vein. It is administered in a hospital or clinic by a specially trained health care professional. Talk to your pediatrician regarding the use of this medicine in children. Special care may be needed. Overdosage: If you think you have taken too much of this medicine contact a poison control center or emergency room at once. NOTE: This medicine is only for you. Do not share this medicine with others. What if I miss a dose? It is important not to miss your dose. Call your doctor or health care professional if you are unable to keep an appointment. What may interact with this medicine? -cyclosporine -medicines to increase blood counts like filgrastim, pegfilgrastim, sargramostim -vaccines This list may not describe all possible interactions. Give your health care provider a list of all the medicines, herbs, non-prescription drugs, or dietary supplements you use. Also tell them if you smoke, drink alcohol, or use illegal drugs. Some items may interact with your medicine. What should I watch for while using this medicine? Visit your doctor for checks on your progress. This drug may make you feel generally unwell. This is not uncommon, as chemotherapy can affect healthy cells as well as cancer cells. Report any side effects. Continue your course of treatment even though you feel ill unless your doctor tells you to stop. In some cases, you may be given additional medicines to help  with side effects. Follow all directions for their use. Call your doctor or health care professional for advice if you get a fever, chills or sore throat, or other symptoms of a cold or flu. Do not treat yourself. This drug decreases your body's ability to fight infections. Try to avoid being around people who are sick. This medicine may increase your risk to bruise or bleed. Call your doctor or health care professional if you  notice any unusual bleeding. Be careful brushing and flossing your teeth or using a toothpick because you may get an infection or bleed more easily. If you have any dental work done, tell your dentist you are receiving this medicine. Avoid taking products that contain aspirin, acetaminophen, ibuprofen, naproxen, or ketoprofen unless instructed by your doctor. These medicines may hide a fever. Do not become pregnant while taking this medicine. Women should inform their doctor if they wish to become pregnant or think they might be pregnant. There is a potential for serious side effects to an unborn child. Talk to your health care professional or pharmacist for more information. Do not breast-feed an infant while taking this medicine. What side effects may I notice from receiving this medicine? Side effects that you should report to your doctor or health care professional as soon as possible: -allergic reactions like skin rash, itching or hives, swelling of the face, lips, or tongue -low blood counts - this medicine may decrease the number of white blood cells, red blood cells and platelets. You may be at increased risk for infections and bleeding. -signs of infection - fever or chills, cough, sore throat, pain or difficulty passing urine -signs of decreased platelets or bleeding - bruising, pinpoint red spots on the skin, black, tarry stools, blood in the urine -signs of decreased red blood cells - unusually weak or tired, fainting spells, lightheadedness -breathing  problems -changes in vision -mouth or throat sores or ulcers -pain, redness, swelling or irritation at the injection site -pain, tingling, numbness in the hands or feet -redness, blistering, peeling or loosening of the skin, including inside the mouth -seizures -vomiting Side effects that usually do not require medical attention (report to your doctor or health care professional if they continue or are bothersome): -diarrhea -hair loss -loss of appetite -nausea -stomach pain This list may not describe all possible side effects. Call your doctor for medical advice about side effects. You may report side effects to FDA at 1-800-FDA-1088. Where should I keep my medicine? This drug is given in a hospital or clinic and will not be stored at home. NOTE: This sheet is a summary. It may not cover all possible information. If you have questions about this medicine, talk to your doctor, pharmacist, or health care provider.  2015, Elsevier/[employer] Standard. (2007-09-02 17:24:12) Pegfilgrastim injection What is this medicine? PEGFILGRASTIM (peg fil GRA stim) is a long-acting granulocyte colony-stimulating factor that stimulates the growth of neutrophils, a type of white blood cell important in the body's fight against infection. It is used to reduce the incidence of fever and infection in patients with certain types of cancer who are receiving chemotherapy that affects the bone marrow. This medicine may be used for other purposes; ask your health care provider or pharmacist if you have questions. COMMON BRAND NAME(S): Neulasta What should I tell my health care provider before I take this medicine? They need to know if you have any of these conditions: -latex allergy -ongoing radiation therapy -sickle cell disease -skin reactions to acrylic adhesives (On-Body Injector only) -an unusual or allergic reaction to pegfilgrastim, filgrastim, other medicines, foods, dyes, or preservatives -pregnant or  trying to get pregnant -breast-feeding How should I use this medicine? This medicine is for injection under the skin. If you get this medicine at home, you will be taught how to prepare and give the pre-filled syringe or how to use the On-body Injector. Refer to the patient Instructions for Use for detailed instructions. Use exactly as  directed. Take your medicine at regular intervals. Do not take your medicine more often than directed. It is important that you put your used needles and syringes in a special sharps container. Do not put them in a trash can. If you do not have a sharps container, call your pharmacist or healthcare provider to get one. Talk to your pediatrician regarding the use of this medicine in children. Special care may be needed. Overdosage: If you think you have taken too much of this medicine contact a poison control center or emergency room at once. NOTE: This medicine is only for you. Do not share this medicine with others. What if I miss a dose? It is important not to miss your dose. Call your doctor or health care professional if you miss your dose. If you miss a dose due to an On-body Injector failure or leakage, a new dose should be administered as soon as possible using a single prefilled syringe for manual use. What may interact with this medicine? Interactions have not been studied. Give your health care provider a list of all the medicines, herbs, non-prescription drugs, or dietary supplements you use. Also tell them if you smoke, drink alcohol, or use illegal drugs. Some items may interact with your medicine. This list may not describe all possible interactions. Give your health care provider a list of all the medicines, herbs, non-prescription drugs, or dietary supplements you use. Also tell them if you smoke, drink alcohol, or use illegal drugs. Some items may interact with your medicine. What should I watch for while using this medicine? You may need blood work  done while you are taking this medicine. If you are going to need a MRI, CT scan, or other procedure, tell your doctor that you are using this medicine (On-Body Injector only). What side effects may I notice from receiving this medicine? Side effects that you should report to your doctor or health care professional as soon as possible: -allergic reactions like skin rash, itching or hives, swelling of the face, lips, or tongue -dizziness -fever -pain, redness, or irritation at site where injected -pinpoint red spots on the skin -shortness of breath or breathing problems -stomach or side pain, or pain at the shoulder -swelling -tiredness -trouble passing urine Side effects that usually do not require medical attention (report to your doctor or health care professional if they continue or are bothersome): -bone pain -muscle pain This list may not describe all possible side effects. Call your doctor for medical advice about side effects. You may report side effects to FDA at 1-800-FDA-1088. Where should I keep my medicine? Keep out of the reach of children. Store pre-filled syringes in a refrigerator between 2 and 8 degrees C (36 and 46 degrees F). Do not freeze. Keep in carton to protect from light. Throw away this medicine if it is left out of the refrigerator for more than 48 hours. Throw away any unused medicine after the expiration date. NOTE: This sheet is a summary. It may not cover all possible information. If you have questions about this medicine, talk to your doctor, pharmacist, or health care provider.  2015, Elsevier/Gold Standard. (2013-07-31 16:14:05) Prochlorperazine tablets What is this medicine? PROCHLORPERAZINE (proe klor PER a zeen) helps to control severe nausea and vomiting. This medicine is also used to treat schizophrenia. It can also help patients who experience anxiety that is not due to psychological illness. This medicine may be used for other purposes; ask your  health care provider or pharmacist  if you have questions. COMMON BRAND NAME(S): Compazine What should I tell my health care provider before I take this medicine? They need to know if you have any of these conditions: -blood disorders or disease -dementia -liver disease or jaundice -Parkinson's disease -uncontrollable movement disorder -an unusual or allergic reaction to prochlorperazine, other medicines, foods, dyes, or preservatives -pregnant or trying to get pregnant -breast-feeding How should I use this medicine? Take this medicine by mouth with a glass of water. Follow the directions on the prescription label. Take your doses at regular intervals. Do not take your medicine more often than directed. Do not stop taking this medicine suddenly. This can cause nausea, vomiting, and dizziness. Ask your doctor or health care professional for advice. Talk to your pediatrician regarding the use of this medicine in children. Special care may be needed. While this drug may be prescribed for children as young as 2 years for selected conditions, precautions do apply. Overdosage: If you think you have taken too much of this medicine contact a poison control center or emergency room at once. NOTE: This medicine is only for you. Do not share this medicine with others. What if I miss a dose? If you miss a dose, take it as soon as you can. If it is almost time for your next dose, take only that dose. Do not take double or extra doses. What may interact with this medicine? Do not take this medicine with any of the following medications: -amoxapine -antidepressants like citalopram, escitalopram, fluoxetine, paroxetine, and sertraline -deferoxamine -dofetilide -maprotiline -tricyclic antidepressants like amitriptyline, clomipramine, imipramine, nortiptyline and others This medicine may also interact with the following medications: -lithium -medicines for pain -phenytoin -propranolol -warfarin This  list may not describe all possible interactions. Give your health care provider a list of all the medicines, herbs, non-prescription drugs, or dietary supplements you use. Also tell them if you smoke, drink alcohol, or use illegal drugs. Some items may interact with your medicine. What should I watch for while using this medicine? Visit your doctor or health care professional for regular checks on your progress. You may get drowsy or dizzy. Do not drive, use machinery, or do anything that needs mental alertness until you know how this medicine affects you. Do not stand or sit up quickly, especially if you are an older patient. This reduces the risk of dizzy or fainting spells. Alcohol may interfere with the effect of this medicine. Avoid alcoholic drinks. This medicine can reduce the response of your body to heat or cold. Dress warm in cold weather and stay hydrated in hot weather. If possible, avoid extreme temperatures like saunas, hot tubs, very hot or cold showers, or activities that can cause dehydration such as vigorous exercise. This medicine can make you more sensitive to the sun. Keep out of the sun. If you cannot avoid being in the sun, wear protective clothing and use sunscreen. Do not use sun lamps or tanning beds/booths. Your mouth may get dry. Chewing sugarless gum or sucking hard candy, and drinking plenty of water may help. Contact your doctor if the problem does not go away or is severe. What side effects may I notice from receiving this medicine? Side effects that you should report to your doctor or health care professional as soon as possible: -blurred vision -breast enlargement in men or women -breast milk in women who are not breast-feeding -chest pain, fast or irregular heartbeat -confusion, restlessness -dark yellow or brown urine -difficulty breathing or swallowing -dizziness or  fainting spells -drooling, shaking, movement difficulty (shuffling walk) or rigidity -fever,  chills, sore throat -involuntary or uncontrollable movements of the eyes, mouth, head, arms, and legs -seizures -stomach area pain -unusually weak or tired -unusual bleeding or bruising -yellowing of skin or eyes Side effects that usually do not require medical attention (report to your doctor or health care professional if they continue or are bothersome): -difficulty passing urine -difficulty sleeping -headache -sexual dysfunction -skin rash, or itching This list may not describe all possible side effects. Call your doctor for medical advice about side effects. You may report side effects to FDA at 1-800-FDA-1088. Where should I keep my medicine? Keep out of the reach of children. Store at room temperature between 15 and 30 degrees C (59 and 86 degrees F). Protect from light. Throw away any unused medicine after the expiration date. NOTE: This sheet is a summary. It may not cover all possible information. If you have questions about this medicine, talk to your doctor, pharmacist, or health care provider.  2015, Elsevier/[employer] Standard. (2011-09-19 16:59:39) Ondansetron tablets What is this medicine? ONDANSETRON (on DAN se tron) is used to treat nausea and vomiting caused by chemotherapy. It is also used to prevent or treat nausea and vomiting after surgery. This medicine may be used for other purposes; ask your health care provider or pharmacist if you have questions. COMMON BRAND NAME(S): Zofran What should I tell my health care provider before I take this medicine? They need to know if you have any of these conditions: -heart disease -history of irregular heartbeat -liver disease -low levels of magnesium or potassium in the blood -an unusual or allergic reaction to ondansetron, granisetron, other medicines, foods, dyes, or preservatives -pregnant or trying to get pregnant -breast-feeding How should I use this medicine? Take this medicine by mouth with a glass of water. Follow  the directions on your prescription label. Take your doses at regular intervals. Do not take your medicine more often than directed. Talk to your pediatrician regarding the use of this medicine in children. Special care may be needed. Overdosage: If you think you have taken too much of this medicine contact a poison control center or emergency room at once. NOTE: This medicine is only for you. Do not share this medicine with others. What if I miss a dose? If you miss a dose, take it as soon as you can. If it is almost time for your next dose, take only that dose. Do not take double or extra doses. What may interact with this medicine? Do not take this medicine with any of the following medications: -apomorphine -certain medicines for fungal infections like fluconazole, itraconazole, ketoconazole, posaconazole, voriconazole -cisapride -dofetilide -dronedarone -pimozide -thioridazine -ziprasidone This medicine may also interact with the following medications: -carbamazepine -certain medicines for depression, anxiety, or psychotic disturbances -fentanyl -linezolid -MAOIs like Carbex, Eldepryl, Marplan, Nardil, and Parnate -methylene blue (injected into a vein) -other medicines that prolong the QT interval (cause an abnormal heart rhythm) -phenytoin -rifampicin -tramadol This list may not describe all possible interactions. Give your health care provider a list of all the medicines, herbs, non-prescription drugs, or dietary supplements you use. Also tell them if you smoke, drink alcohol, or use illegal drugs. Some items may interact with your medicine. What should I watch for while using this medicine? Check with your doctor or health care professional right away if you have any sign of an allergic reaction. What side effects may I notice from receiving this medicine? Side effects  that you should report to your doctor or health care professional as soon as possible: -allergic reactions  like skin rash, itching or hives, swelling of the face, lips or tongue -breathing problems -confusion -dizziness -fast or irregular heartbeat -feeling faint or lightheaded, falls -fever and chills -loss of balance or coordination -seizures -sweating -swelling of the hands or feet -tightness in the chest -tremors -unusually weak or tired Side effects that usually do not require medical attention (report to your doctor or health care professional if they continue or are bothersome): -constipation or diarrhea -headache This list may not describe all possible side effects. Call your doctor for medical advice about side effects. You may report side effects to FDA at 1-800-FDA-1088. Where should I keep my medicine? Keep out of the reach of children. Store between 2 and 30 degrees C (36 and 86 degrees F). Throw away any unused medicine after the expiration date. NOTE: This sheet is a summary. It may not cover all possible information. If you have questions about this medicine, talk to your doctor, pharmacist, or health care provider.  2015, Elsevier/Gold Standard. (2013-02-05 16:27:45)

## 2014-11-04 NOTE — Progress Notes (Deleted)
[place] Clinical Social Work  Clinical Social Work was referred by patient navigator for assessment of psychosocial needs due to new patient.  Clinical Education officer, museum met with patient and son (at [facility], at home, or in the hospital) to offer support and assess for needs.      Clinical Social Work interventions:      Loren Racer, [place] Tuesdays 8:30-1pm Wednesdays 8:30-12pm  [phone]

## 2014-11-04 NOTE — Progress Notes (Signed)
Chemo teaching done and consent signed for Carboplatin & Etoposide. Chemo calendar given. Scott notified that patient will need Neulasta OBI for Friday.

## 2014-11-04 NOTE — Progress Notes (Signed)
[facility] Psychosocial Distress Screening Clinical Social Work  Clinical Social Work was referred by distress screening protocol.  The patient scored a 4 on the Psychosocial Distress Thermometer which indicates mild distress. Clinical Social Worker met with pt and son to assess for distress and other psychosocial needs. CSW introduced self and explained role of CSW. CSW also provided pt and son with sheet of transportation resources and CSW information. CSW discussed with pt and son common emotions experienced by patients and coping techniques. Pt and family interested in support programs to both [facility] and [facility]. Pt and son agreeable to reaching out to CSW as needed.   ON[facility]N DISTRESS SCREENING 11/04/2014  Screening Type Initial Screening  Distress experienced in past week (1-10) 4  Practical problem type Transportation  Emotional problem type Depression;Nervousness/Anxiety;Adjusting to illness;Feeling hopeless  Physical Problem type Pain;Sleep/insomnia;Getting around;Loss of appetitie;Breathing  Physician notified of physical symptoms Yes  Referral to clinical social work Yes    Clinical Social Worker follow up needed: No.  If yes, follow up plan: Loren Racer, [place] Tuesdays 8:30-1pm Wednesdays 8:30-12pm  [phone]

## 2014-11-04 NOTE — Progress Notes (Signed)
Tolerated chemo well.  IV without any s/s infiltration or irritation, good blood return.  IV was flushed well with saline and wrapped with gauze.

## 2014-11-05 ENCOUNTER — Encounter (HOSPITAL_BASED_OUTPATIENT_CLINIC_OR_DEPARTMENT_OTHER): Payer: Medicare Other

## 2014-11-05 ENCOUNTER — Other Ambulatory Visit (HOSPITAL_COMMUNITY): Payer: Self-pay | Admitting: Oncology

## 2014-11-05 VITALS — BP 120/60 | HR 87 | Temp 97.8°F | Resp 16 | Wt 168.0 lb

## 2014-11-05 DIAGNOSIS — Z5111 Encounter for antineoplastic chemotherapy: Secondary | ICD-10-CM

## 2014-11-05 DIAGNOSIS — C787 Secondary malignant neoplasm of liver and intrahepatic bile duct: Secondary | ICD-10-CM | POA: Diagnosis not present

## 2014-11-05 DIAGNOSIS — C801 Malignant (primary) neoplasm, unspecified: Secondary | ICD-10-CM

## 2014-11-05 DIAGNOSIS — R160 Hepatomegaly, not elsewhere classified: Secondary | ICD-10-CM

## 2014-11-05 MED ORDER — ALPRAZOLAM 0.25 MG PO TABS: 0.2500 mg | ORAL_TABLET | Freq: Every day | ORAL | Status: DC

## 2014-11-05 MED ORDER — ETOPOSIDE CHEMO INJECTION 1 GM/50ML
100.0000 mg/m2 | Freq: Once | INTRAVENOUS | Status: AC
  Administered 2014-11-05: 190 mg via INTRAVENOUS
  Filled 2014-11-05: qty 9.5

## 2014-11-05 MED ORDER — SODIUM CHLORIDE 0.9 % IV SOLN
Freq: Once | INTRAVENOUS | Status: AC
Start: 2014-11-05 — End: 2014-11-05
  Administered 2014-11-05: 09:00:00 via INTRAVENOUS

## 2014-11-05 MED ORDER — SODIUM CHLORIDE 0.9 % IJ SOLN: 10.0000 mL | INTRAMUSCULAR | Status: DC | PRN

## 2014-11-05 MED ORDER — SODIUM CHLORIDE 0.9 % IV SOLN
6.0000 mg | Freq: Once | INTRAVENOUS | Status: AC
  Administered 2014-11-05: 6 mg via INTRAVENOUS
  Filled 2014-11-05: qty 4

## 2014-11-05 MED ORDER — HEPARIN SOD (PORK) LOCK FLUSH 100 UNIT/ML IV SOLN: 500.0000 [IU] | Freq: Once | INTRAVENOUS | Status: DC | PRN

## 2014-11-05 MED ORDER — SODIUM CHLORIDE 0.9 % IV SOLN
Freq: Once | INTRAVENOUS | Status: AC
  Administered 2014-11-05: 09:00:00 via INTRAVENOUS
  Filled 2014-11-05: qty 4

## 2014-11-05 NOTE — Progress Notes (Signed)
Patient tolerated chemo well.  IV with good blood return and no irritation, wrapped with gauze.

## 2014-11-06 ENCOUNTER — Encounter (HOSPITAL_BASED_OUTPATIENT_CLINIC_OR_DEPARTMENT_OTHER): Payer: Medicare Other

## 2014-11-06 VITALS — BP 129/69 | HR 70 | Temp 97.6°F | Resp 16

## 2014-11-06 DIAGNOSIS — Z5189 Encounter for other specified aftercare: Secondary | ICD-10-CM | POA: Diagnosis not present

## 2014-11-06 DIAGNOSIS — Z5111 Encounter for antineoplastic chemotherapy: Secondary | ICD-10-CM | POA: Diagnosis not present

## 2014-11-06 DIAGNOSIS — R160 Hepatomegaly, not elsewhere classified: Secondary | ICD-10-CM

## 2014-11-06 DIAGNOSIS — C801 Malignant (primary) neoplasm, unspecified: Secondary | ICD-10-CM | POA: Diagnosis not present

## 2014-11-06 DIAGNOSIS — C787 Secondary malignant neoplasm of liver and intrahepatic bile duct: Secondary | ICD-10-CM | POA: Diagnosis not present

## 2014-11-06 MED ORDER — PEGFILGRASTIM 6 MG/0.6ML ~~LOC~~ PSKT
6.0000 mg | PREFILLED_SYRINGE | Freq: Once | SUBCUTANEOUS | Status: AC
  Administered 2014-11-06: 6 mg via SUBCUTANEOUS
  Filled 2014-11-06: qty 0.6

## 2014-11-06 MED ORDER — SODIUM CHLORIDE 0.9 % IV SOLN
Freq: Once | INTRAVENOUS | Status: AC
  Administered 2014-11-06: 09:00:00 via INTRAVENOUS

## 2014-11-06 MED ORDER — SODIUM CHLORIDE 0.9 % IV SOLN
100.0000 mg/m2 | Freq: Once | INTRAVENOUS | Status: AC
  Administered 2014-11-06: 190 mg via INTRAVENOUS
  Filled 2014-11-06: qty 9.5

## 2014-11-06 MED ORDER — SODIUM CHLORIDE 0.9 % IV SOLN
Freq: Once | INTRAVENOUS | Status: AC
  Administered 2014-11-06: 09:00:00 via INTRAVENOUS
  Filled 2014-11-06: qty 4

## 2014-11-06 MED ORDER — SODIUM CHLORIDE 0.9 % IJ SOLN
10.0000 mL | INTRAMUSCULAR | Status: DC | PRN
Start: 2014-11-06 — End: 2014-11-06

## 2014-11-06 NOTE — Progress Notes (Signed)
Tolerated chemo well.  IV removed after treatment r/t infusion complete.  Neulasta injector applied and patient and son understand instructions.

## 2014-11-09 ENCOUNTER — Ambulatory Visit (INDEPENDENT_AMBULATORY_CARE_PROVIDER_SITE_OTHER): Payer: Medicare Other | Admitting: Family Medicine

## 2014-11-09 ENCOUNTER — Encounter: Payer: Self-pay | Admitting: Family Medicine

## 2014-11-09 ENCOUNTER — Ambulatory Visit (HOSPITAL_COMMUNITY)
Admission: RE | Admit: 2014-11-09 | Discharge: 2014-11-09 | Disposition: A | Discharge status: Home / Self Care | Payer: Medicare Other | Source: Ambulatory Visit | Attending: Hematology & Oncology | Admitting: Hematology & Oncology

## 2014-11-09 ENCOUNTER — Telehealth (HOSPITAL_COMMUNITY): Payer: Self-pay | Admitting: Oncology

## 2014-11-09 VITALS — BP 100/65 | HR 77 | Temp 97.5°F | Ht 68.0 in | Wt 170.0 lb

## 2014-11-09 DIAGNOSIS — Z79899 Other long term (current) drug therapy: Secondary | ICD-10-CM | POA: Insufficient documentation

## 2014-11-09 DIAGNOSIS — C801 Malignant (primary) neoplasm, unspecified: Secondary | ICD-10-CM

## 2014-11-09 DIAGNOSIS — C3491 Malignant neoplasm of unspecified part of right bronchus or lung: Secondary | ICD-10-CM | POA: Diagnosis not present

## 2014-11-09 DIAGNOSIS — C7951 Secondary malignant neoplasm of bone: Secondary | ICD-10-CM | POA: Diagnosis not present

## 2014-11-09 DIAGNOSIS — Z08 Encounter for follow-up examination after completed treatment for malignant neoplasm: Secondary | ICD-10-CM | POA: Insufficient documentation

## 2014-11-09 DIAGNOSIS — Z902 Acquired absence of lung [part of]: Secondary | ICD-10-CM

## 2014-11-09 DIAGNOSIS — I480 Paroxysmal atrial fibrillation: Secondary | ICD-10-CM | POA: Diagnosis not present

## 2014-11-09 DIAGNOSIS — C787 Secondary malignant neoplasm of liver and intrahepatic bile duct: Secondary | ICD-10-CM | POA: Diagnosis not present

## 2014-11-09 DIAGNOSIS — Z9889 Other specified postprocedural states: Secondary | ICD-10-CM

## 2014-11-09 DIAGNOSIS — F4321 Adjustment disorder with depressed mood: Secondary | ICD-10-CM

## 2014-11-09 DIAGNOSIS — R160 Hepatomegaly, not elsewhere classified: Secondary | ICD-10-CM

## 2014-11-09 MED ORDER — ALPRAZOLAM 0.25 MG PO TABS: 0.2500 mg | ORAL_TABLET | Freq: Two times a day (BID) | ORAL | Status: AC | PRN

## 2014-11-09 MED ORDER — IOHEXOL 300 MG/ML  SOLN
75.0000 mL | Freq: Once | INTRAMUSCULAR | Status: AC | PRN
  Administered 2014-11-09: 75 mL via INTRAVENOUS

## 2014-11-09 NOTE — Progress Notes (Signed)
Subjective:    Patient ID: Craig Dean, male    DOB: 1931/08/23, 81 y.o.   MRN: [id number]  HPI Patient here today for hospital follow up from [place]. He was seen there and diagnosed with liver mets and small cell carcinoma.      Patient Active Problem List   Diagnosis Date Noted  . Small cell carcinoma 11/04/2014  . Liver mass   . Malnutrition of moderate degree 10/28/2014  . Hepatic metastases 10/28/2014  . Abdominal swelling, mass, or lump 10/27/2014  . Abdominal pain 10/27/2014  . Chronic arthralgias of knees and hips 06/23/2014  . Elevated PSA 02/16/2014  . Prostate cancer 02/16/2014  . H/O pneumonectomy 02/16/2014  . Vitamin D deficiency 04/28/2013  . History of lung cancer 04/28/2013  . History of cholelithiasis 04/28/2013  . History of prostatectomy 04/28/2013  . AORTIC VALVE DISORDERS 07/14/2009  . EDEMA 07/14/2009  . Chronic systolic congestive heart failure 06/09/2009  . Hyperlipidemia 05/28/2009  . HTN (hypertension) 05/28/2009  . Coronary atherosclerosis 05/28/2009  . Atrial fibrillation 05/28/2009  . Arthropathy 05/28/2009   Outpatient Encounter Prescriptions as of 11/09/2014  Medication Sig  . acetaminophen (TYLENOL ARTHRITIS PAIN) 650 MG CR tablet Take 650 mg by mouth every 8 (eight) hours as needed.    . ALPRAZolam (XANAX) 0.25 MG tablet Take 1 tablet (0.25 mg total) by mouth daily.  Marland Kitchen aspirin 81 MG tablet Take 81 mg by mouth daily.    Marland Kitchen CARBOPLATIN IV Inject into the vein every 21 ( twenty-one) days. Day 1 every 21 days  . carvedilol (COREG) 6.25 MG tablet Take 1 tablet (6.25 mg total) by mouth 2 (two) times daily with a meal.  . enalapril (VASOTEC) 20 MG tablet Take 0.5 tablets (10 mg total) by mouth daily.  . ETOPOSIDE IV Inject into the vein every 21 ( twenty-one) days. Days 1,2,3 every 21 days  . FLUoxetine (PROZAC) 10 MG capsule Take 1 capsule (10 mg total) by mouth daily.  Marland Kitchen HYDROcodone-acetaminophen (NORCO/VICODIN) 5-325 MG per tablet  Take 1-2 tablets by mouth every 4 (four) hours as needed for moderate pain.  Marland Kitchen ondansetron (ZOFRAN) 8 MG tablet Take 1 tablet (8 mg total) by mouth every 8 (eight) hours as needed for nausea or vomiting.  . Pegfilgrastim (NEULASTA ONPRO [place]) Inject into the skin every 21 ( twenty-one) days. Administered 27 hours after completion of chemo  . prochlorperazine (COMPAZINE) 10 MG tablet Take 1 tablet (10 mg total) by mouth every 6 (six) hours as needed for nausea or vomiting.   No facility-administered encounter medications on file as of 11/09/2014.      Review of Systems  Constitutional: Positive for fatigue.  HENT: Negative.   Eyes: Negative.   Respiratory: Negative.   Cardiovascular: Negative.   Gastrointestinal: Negative.   Endocrine: Negative.   Genitourinary: Negative.   Musculoskeletal: Positive for arthralgias.  Skin: Negative.   Allergic/Immunologic: Negative.   Neurological: Negative.   Hematological: Negative.   Psychiatric/Behavioral: Negative.        Objective:   Physical Exam  Constitutional: He is oriented to person, place, and time. He appears distressed.  The patient is somewhat depressed and tearful today because of his recent diagnosis of small cell cancer with metastases in the liver and bone.  HENT:  Head: Normocephalic and atraumatic.  Eyes: Conjunctivae and EOM are normal. Pupils are equal, round, and reactive to light. Right eye exhibits no discharge. Left eye exhibits no discharge.  Neck: Normal range of motion.  Cardiovascular: Normal rate, regular rhythm and normal heart sounds.  Exam reveals no friction rub.   No murmur heard. Pulmonary/Chest: Effort normal. No respiratory distress. He has no wheezes. He has no rales.  Patient only has breath sounds on the left and none on the right because of a pneumonectomy secondary to cancer in the past.  Abdominal: Soft. He exhibits mass. He exhibits no distension. There is tenderness. There is no rebound and no  guarding.  The liver edge remains palpable.  Musculoskeletal: He exhibits no edema or tenderness.  The patient is in a wheelchair because of his weakness.  Neurological: He is alert and oriented to person, place, and time.  Skin: Skin is warm.  Psychiatric: His behavior is normal. Judgment and thought content normal.  The mood is depressed.  Nursing note and vitals reviewed.  BP 100/65 mmHg  Pulse 77  Temp(Src) 97.5 F (36.4 C) (Oral)  Ht '5\' 8"'$  (1.727 m)  Wt 170 lb (77.111 kg)  BMI 25.85 kg/m2        Assessment & Plan:  1. Malignant small cell cancer -The patient also has an elevated PSA and most likely prostate cancer -According to the reports he has metastases to the bone and the liver. -He will continue follow-up with oncology and radiology for further evaluation with scans and an MRI of the brain to make sure there is no brain metastasis. -He is already had 3 chemotherapy treatments and more are planned.  2. Adjustment disorder with depressed mood -We will have him increase his Xanax to 1 twice daily 0.25. He can take a half a one at a time if needed.  3. H/O pneumonectomy -This was years ago from lung cancer.  4. Paroxysmal atrial fibrillation -The rhythm appears to be stable today. There is no pedal edema.  Meds ordered this encounter  Medications  . ALPRAZolam (XANAX) 0.25 MG tablet    Sig: Take 1 tablet (0.25 mg total) by mouth 2 (two) times daily as needed for anxiety.    Dispense:  60 tablet    Refill:  1   Patient Instructions  Patient will be given an appointment for 4 weeks to return to office if he is busy with seeing specialists he can call back and reschedule that for 4 weeks later The patient and his son were told that they could feel free to come by the office and get blood work any time and that our lab technician would go outside in the emergency area in the parking lot and draw the blood if that would be helpful to them. I did encourage the patient  to do deep breathing exercises to keep his good lying in good working order He understands that he has a tough road ahead and that he is willing to try to get through this in a positive way.   Arrie Senate MD

## 2014-11-09 NOTE — Telephone Encounter (Signed)
Craig Dean's son brought patient to hospital for scans today - came to cancer center to report issues with constipation over the weekend.  Education and handout provided.  Per Dr. Whitney Muse, patient advised on Mirlax, MOM, and stool softner; verbalized understanding.

## 2014-11-09 NOTE — Patient Instructions (Signed)
Patient will be given an appointment for 4 weeks to return to office if he is busy with seeing specialists he can call back and reschedule that for 4 weeks later The patient and his son were told that they could feel free to come by the office and get blood work any time and that our lab technician would go outside in the emergency area in the parking lot and draw the blood if that would be helpful to them. I did encourage the patient to do deep breathing exercises to keep his good lying in good working order He understands that he has a tough road ahead and that he is willing to try to get through this in a positive way.

## 2014-11-10 ENCOUNTER — Other Ambulatory Visit (HOSPITAL_COMMUNITY): Payer: Medicare Other

## 2014-11-11 ENCOUNTER — Encounter (HOSPITAL_BASED_OUTPATIENT_CLINIC_OR_DEPARTMENT_OTHER): Payer: Medicare Other | Admitting: Hematology & Oncology

## 2014-11-11 VITALS — BP 100/49 | HR 79 | Temp 98.0°F | Resp 16 | Wt 166.4 lb

## 2014-11-11 DIAGNOSIS — C787 Secondary malignant neoplasm of liver and intrahepatic bile duct: Secondary | ICD-10-CM

## 2014-11-11 DIAGNOSIS — C801 Malignant (primary) neoplasm, unspecified: Secondary | ICD-10-CM | POA: Diagnosis not present

## 2014-11-11 DIAGNOSIS — Z85118 Personal history of other malignant neoplasm of bronchus and lung: Secondary | ICD-10-CM

## 2014-11-11 DIAGNOSIS — Z8546 Personal history of malignant neoplasm of prostate: Secondary | ICD-10-CM

## 2014-11-11 DIAGNOSIS — C7951 Secondary malignant neoplasm of bone: Secondary | ICD-10-CM | POA: Diagnosis not present

## 2014-11-11 DIAGNOSIS — K5900 Constipation, unspecified: Secondary | ICD-10-CM | POA: Diagnosis not present

## 2014-11-11 NOTE — Progress Notes (Signed)
Craig Dean, [place] [address]   DIAGNOSIS:  Extensive Stage Small Cell Carcinoma with osseous and liver metastases Questionable prostatic primary, known history of high grade prostate carcinoma CT chest on 6/28 without obvious chest primary Prior history of NSCLC treated at [facility]  CURRENT THERAPY: Carboplatin/Etoposide  INTERVAL HISTORY: Craig Dean 81 y.o. male returns for follow-up of Small cell carcinoma.  The patient is here with his son today and presents for a follow up after his first chemotherapy treatment.  He says that he is feeling better.  He has had 3 bowel movements since yesterday.  His son states that he has taken 2 doses of mirilax. He has had problems with constipation that worsened after chemotherapy.  The patient states that he sleeps some and eats but not a lot.  He denies nausea  His son says that he has gotten stiffer as of the end of last week.  He also states that the patients previous panic attacks are now better.  He says that Dr. Laurance Flatten prescribed a 2 mg nerve pill and would like to lower the dosage to 1.5. He feels that his current dose makes him too sleepy.  He denies nausea, vomiting. His RUQ pain is slightly improved but persistent.    MEDICAL HISTORY: Past Medical History  Diagnosis Date  . HYPERLIPIDEMIA   . HYPERTENSION   . CAD   . Aortic valve disorders   . Atrial fibrillation   . CHF   . Edema   . ITP (idiopathic thrombocytopenic purpura)   . Lung cancer   . Prostate cancer     has Hyperlipidemia; HTN (hypertension); Coronary atherosclerosis; AORTIC VALVE DISORDERS; Atrial fibrillation; Chronic systolic congestive heart failure; Arthropathy; EDEMA; Vitamin D deficiency; History of lung cancer; History of cholelithiasis; History of prostatectomy; Elevated PSA; Prostate cancer; H/O pneumonectomy; Chronic arthralgias of knees and hips; Abdominal swelling, mass, or lump; Abdominal pain; Malnutrition of  moderate degree; Hepatic metastases; Liver mass; Small cell carcinoma; Leukopenia; Neutropenic fever; and UTI (lower urinary tract infection) on his problem list.     is allergic to penicillins and statins.    SURGICAL HISTORY: Past Surgical History  Procedure Laterality Date  . Carpal tunnel release    . Pneumonectomy    . Prostatectomy    . Liver biopsy  10/2014    SOCIAL HISTORY: History   Social History  . Marital Status: Married    Spouse Name: N/A  . Number of Children: N/A  . Years of Education: N/A   Occupational History  . Not on file.   Social History Main Topics  . Smoking status: Former Smoker    Types: Cigarettes    Quit date: 05/15/1982  . Smokeless tobacco: Not on file  . Alcohol Use: No  . Drug Use: No  . Sexual Activity: Not on file   Other Topics Concern  . Not on file   Social History Narrative    FAMILY HISTORY: Family History  Problem Relation Age of Onset  . Alzheimer's disease Father     Review of Systems  Musculoskeletal:       Stiffness  All other systems reviewed and are negative. 14 point review of systems was performed and is negative except as detailed under history of present illness and above   PHYSICAL EXAMINATION  ECOG PERFORMANCE STATUS: 2 - Symptomatic, <50% confined to bed  Filed Vitals:   11/11/14 0922  BP: 100/49  Pulse: 79  Temp: 98  F (36.7 C)  Resp: 16    Physical Exam  Constitutional: He is oriented to person, place, and time and well-developed, well-nourished, and in no distress.  HENT:  Head: Normocephalic and atraumatic.  Right Ear: External ear normal.  Left Ear: External ear normal.  Mouth/Throat: Oropharynx is clear and moist.  Eyes: Conjunctivae and EOM are normal. Pupils are equal, round, and reactive to light.  Neck: Normal range of motion. Neck supple.  Cardiovascular:  Loud systolic ejection murmur, R upper sternum border   Pulmonary/Chest:  No R upper quadrant pain with palpation    Abdominal: Soft. Bowel sounds are normal.  Musculoskeletal: Normal range of motion.  Neurological: He is alert and oriented to person, place, and time.  Skin: Skin is warm and dry.  Nursing note and vitals reviewed.   LABORATORY DATA: Results for Craig, Dean (MRN [id number])   Ref. Range 11/03/2014 17:18  Sodium Latest Ref Range: 135-145 mmol/L 140  Potassium Latest Ref Range: 3.5-5.1 mmol/L 4.1  Chloride Latest Ref Range: 101-111 mmol/L 97 (L)  CO2 Latest Ref Range: 22-32 mmol/L 32  BUN Latest Ref Range: 6-20 mg/dL 23 (H)  Creatinine Latest Ref Range: 0.61-1.24 mg/dL 1.02  Calcium Latest Ref Range: 8.9-10.3 mg/dL 8.9  EGFR (Non-African Amer.) Latest Ref Range: >60 mL/min >60  EGFR (African American) Latest Ref Range: >60 mL/min >60  Glucose Latest Ref Range: 65-99 mg/dL 138 (H)  Anion gap Latest Ref Range: 5-15  11  Alkaline Phosphatase Latest Ref Range: 38-126 U/L 335 (H)  Albumin Latest Ref Range: 3.5-5.0 g/dL 3.3 (L)  AST Latest Ref Range: 15-41 U/L 87 (H)  ALT Latest Ref Range: 17-63 U/L 41  Total Protein Latest Ref Range: 6.5-8.1 g/dL 6.4 (L)  Total Bilirubin Latest Ref Range: 0.3-1.2 mg/dL 1.8 (H)  WBC Latest Ref Range: 4.0-10.5 K/uL 10.8 (H)  RBC Latest Ref Range: 4.22-5.81 MIL/uL 5.04  Hemoglobin Latest Ref Range: 13.0-17.0 g/dL 14.0  HCT Latest Ref Range: 39.0-52.0 % 43.2  MCV Latest Ref Range: 78.0-100.0 fL 85.7  MCH Latest Ref Range: 26.0-34.0 pg 27.8  MCHC Latest Ref Range: 30.0-36.0 g/dL 32.4  RDW Latest Ref Range: 11.5-15.5 % 14.2  Platelets Latest Ref Range: 150-400 K/uL 184  Neutrophils Latest Ref Range: 43-77 % 82 (H)  Lymphocytes Latest Ref Range: 12-46 % 10 (L)  Monocytes Relative Latest Ref Range: 3-12 % 8  Eosinophil Latest Ref Range: 0-5 % 0  Basophil Latest Ref Range: 0-1 % 0  NEUT# Latest Ref Range: 1.7-7.7 K/uL 8.8 (H)  Lymphocyte # Latest Ref Range: 0.7-4.0 K/uL 1.1  Monocyte # Latest Ref Range: 0.1-1.0 K/uL 0.8  Eosinophils Absolute Latest  Ref Range: 0.0-0.7 K/uL 0.0  Basophils Absolute Latest Ref Range: 0.0-0.1 K/uL 0.0   RADIOGRAPHIC STUDIES: CLINICAL DATA: Restaging metastatic small cell cancer. Currently undergoing chemotherapy. History of remote right lung cancer with pneumonectomy. Subsequent encounter)  EXAM: CT CHEST WITH CONTRAST  TECHNIQUE: Multidetector CT imaging of the chest was performed during intravenous contrast administration.  CONTRAST: 57m OMNIPAQUE IOHEXOL 300 MG/ML SOLN  COMPARISON: Radiographs 10/27/2014. Chest CT 03/08/2012. Abdominal CT 10/27/2014.  FINDINGS: Mediastinum/Nodes: A small right high paratracheal node on image 14 is stable. No pathologically enlarged mediastinal, hilar or axillary lymph nodes are demonstrated. There is stable volume loss in the right hemithorax status post right pneumonectomy. The thyroid gland and esophagus demonstrate no significant findings. There is stable cardiomegaly. No significant pericardial effusion is present.There is extensive atherosclerosis of the aorta, great vessels and coronary arteries. In  addition, aortic valvular calcifications are present.  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 There is a small left pleural effusion which appears simple. The left lung is clear with mild emphysema. There are no suspicious pulmonary nodules.  Upper abdomen: As demonstrated on recent abdominal CT, there is extensive hepatic metastatic disease with multiple ring-enhancing low-density masses. Conglomerate of tumor in the dome of the left hepatic lobe measures 7.4 x 5.8 cm on image 46. Chronic enlargement of the left adrenal gland is unchanged from 2013, consistent with a benign etiology. Gallstones and diffuse vascular calcifications again noted.  Musculoskeletal/Chest wall: Multiple sclerotic lesions are again noted throughout the spine and ribs consistent with metastatic disease. No lytic lesion,  pathologic fracture or epidural tumor demonstrated.  IMPRESSION: 1. No evidence of local recurrence within the chest or extra osseous thoracic metastatic disease. 2. Widespread hepatic metastatic disease as demonstrated on recent abdominal CT. 3. Multifocal blastic osseous metastases without pathologic fracture. 4. Diffuse atherosclerosis with aortic valvular calcifications.   Electronically Signed  By: Richardean Sale M.D.  On: 11/10/2014 08:54   ASSESSMENT and THERAPY PLAN:  Extensive Stage Small Cell Carcinoma with osseous and liver metastases Questionable prostatic primary, known history of high grade prostate carcinoma CT chest on 6/28 without obvious chest primary Prior history of NSCLC [facility] at [facility] Constipation   He did fairly well with his first cycle of chemotherapy. He is quite down in regards to his diagnosis and we discussed this again today.  We have discussed end of life issues at our last encounters.  He and is family understand his disease is terminal. For now he wishes to continue with our current treatment plan.  In regards to his constipation I have encouraged him to add miralax daily.  He was given a constipation sheet. I will see him back again next Thursday.  I discussed with the patient and family that once we are sure he is improving we will space his visits out.  Transportation issues are a problem.  He has a head MRI next week.  Orders Placed This Encounter  Procedures  . CBC with Differential    Standing Status: Future     Number of Occurrences:      Standing Expiration Date: 11/11/2015  . Basic metabolic panel    Standing Status: Future     Number of Occurrences:      Standing Expiration Date: 11/11/2015    All questions were answered. The patient knows to call the clinic with any problems, questions or concerns. We can certainly see the patient much sooner if necessary.    This note was electronically signed.  This document  serves as a record of services personally performed by Ancil Linsey, MD. It was created on her behalf by Janace Hoard, a trained medical scribe. The creation of this record is based on the scribe's personal observations and the provider's statements to them. This document has been checked and approved by the attending provider.  I have reviewed the above documentation for accuracy and completeness, and I agree with the above.  Kelby Fam. Whitney Muse, MD

## 2014-11-11 NOTE — Patient Instructions (Signed)
Craig Dean [place] at [facility] Discharge Instructions  RECOMMENDATIONS MADE BY THE CONSULTANT AND ANY TEST RESULTS WILL BE SENT TO YOUR REFERRING PHYSICIAN.  Return as scheduled. A home health referral will be made for physical therapy.  Thank you for choosing [place] at [facility] to provide your oncology and hematology care.  To afford each patient quality time with our provider, please arrive at least 15 minutes before your scheduled appointment time.    You need to re-schedule your appointment should you arrive 10 or more minutes late.  We strive to give you quality time with our providers, and arriving late affects you and other patients whose appointments are after yours.  Also, if you no show three or more times for appointments you may be dismissed from the clinic at the providers discretion.     Again, thank you for choosing [facility].  Our hope is that these requests will decrease the amount of time that you wait before being seen by our physicians.       _____________________________________________________________  Should you have questions after your visit to [facility], please contact our office at [phone] between the hours of 8:30 a.m. and 4:30 p.m.  Voicemails left after 4:30 p.m. will not be returned until the following business day.  For prescription refill requests, have your pharmacy contact our office.

## 2014-11-14 ENCOUNTER — Inpatient Hospital Stay (HOSPITAL_COMMUNITY)
Admission: EM | Admit: 2014-11-14 | Discharge: 2014-11-16 | DRG: 809 | Disposition: A | Discharge status: Home / Self Care | Payer: Medicare Other | Attending: Internal Medicine | Admitting: Internal Medicine

## 2014-11-14 ENCOUNTER — Emergency Department (HOSPITAL_COMMUNITY): Payer: Medicare Other

## 2014-11-14 ENCOUNTER — Encounter (HOSPITAL_COMMUNITY): Payer: Self-pay | Admitting: Emergency Medicine

## 2014-11-14 ENCOUNTER — Other Ambulatory Visit: Payer: Self-pay

## 2014-11-14 DIAGNOSIS — R531 Weakness: Secondary | ICD-10-CM | POA: Diagnosis present

## 2014-11-14 DIAGNOSIS — C787 Secondary malignant neoplasm of liver and intrahepatic bile duct: Secondary | ICD-10-CM | POA: Diagnosis not present

## 2014-11-14 DIAGNOSIS — Z66 Do not resuscitate: Secondary | ICD-10-CM | POA: Diagnosis present

## 2014-11-14 DIAGNOSIS — T451X5A Adverse effect of antineoplastic and immunosuppressive drugs, initial encounter: Secondary | ICD-10-CM | POA: Diagnosis present

## 2014-11-14 DIAGNOSIS — D72819 Decreased white blood cell count, unspecified: Secondary | ICD-10-CM | POA: Diagnosis not present

## 2014-11-14 DIAGNOSIS — I10 Essential (primary) hypertension: Secondary | ICD-10-CM | POA: Diagnosis present

## 2014-11-14 DIAGNOSIS — C3491 Malignant neoplasm of unspecified part of right bronchus or lung: Secondary | ICD-10-CM | POA: Diagnosis present

## 2014-11-14 DIAGNOSIS — N390 Urinary tract infection, site not specified: Secondary | ICD-10-CM | POA: Diagnosis present

## 2014-11-14 DIAGNOSIS — I4891 Unspecified atrial fibrillation: Secondary | ICD-10-CM | POA: Diagnosis present

## 2014-11-14 DIAGNOSIS — D61810 Antineoplastic chemotherapy induced pancytopenia: Secondary | ICD-10-CM | POA: Diagnosis present

## 2014-11-14 DIAGNOSIS — D701 Agranulocytosis secondary to cancer chemotherapy: Secondary | ICD-10-CM | POA: Diagnosis present

## 2014-11-14 DIAGNOSIS — Z87891 Personal history of nicotine dependence: Secondary | ICD-10-CM

## 2014-11-14 DIAGNOSIS — R5081 Fever presenting with conditions classified elsewhere: Secondary | ICD-10-CM | POA: Diagnosis present

## 2014-11-14 DIAGNOSIS — Z8546 Personal history of malignant neoplasm of prostate: Secondary | ICD-10-CM | POA: Diagnosis not present

## 2014-11-14 DIAGNOSIS — Z7982 Long term (current) use of aspirin: Secondary | ICD-10-CM

## 2014-11-14 DIAGNOSIS — C801 Malignant (primary) neoplasm, unspecified: Secondary | ICD-10-CM | POA: Diagnosis present

## 2014-11-14 DIAGNOSIS — I5022 Chronic systolic (congestive) heart failure: Secondary | ICD-10-CM | POA: Diagnosis present

## 2014-11-14 DIAGNOSIS — D693 Immune thrombocytopenic purpura: Secondary | ICD-10-CM | POA: Diagnosis present

## 2014-11-14 DIAGNOSIS — D709 Neutropenia, unspecified: Secondary | ICD-10-CM | POA: Diagnosis not present

## 2014-11-14 DIAGNOSIS — Z902 Acquired absence of lung [part of]: Secondary | ICD-10-CM | POA: Diagnosis present

## 2014-11-14 DIAGNOSIS — R296 Repeated falls: Secondary | ICD-10-CM | POA: Diagnosis present

## 2014-11-14 DIAGNOSIS — E785 Hyperlipidemia, unspecified: Secondary | ICD-10-CM | POA: Diagnosis present

## 2014-11-14 LAB — COMPREHENSIVE METABOLIC PANEL
ALBUMIN: 3.2 g/dL — AB (ref 3.5–5.0)
ALK PHOS: 199 U/L — AB (ref 38–126)
ALT: 33 U/L (ref 17–63)
AST: 35 U/L (ref 15–41)
Anion gap: 11 (ref 5–15)
BILIRUBIN TOTAL: 2.3 mg/dL — AB (ref 0.3–1.2)
BUN: 29 mg/dL — ABNORMAL HIGH (ref 6–20)
CALCIUM: 8.4 mg/dL — AB (ref 8.9–10.3)
CHLORIDE: 86 mmol/L — AB (ref 101–111)
CO2: 31 mmol/L (ref 22–32)
CREATININE: 1.19 mg/dL (ref 0.61–1.24)
GFR calc Af Amer: >60 mL/min (ref >60)
GFR calc non Af Amer: 56 mL/min — ABNORMAL LOW (ref >60)
GLUCOSE: 125 mg/dL — AB (ref 65–99)
POTASSIUM: 3.9 mmol/L (ref 3.5–5.1)
SODIUM: 128 mmol/L — AB (ref 135–145)
Total Protein: 6 g/dL — ABNORMAL LOW (ref 6.5–8.1)

## 2014-11-14 LAB — CBC WITH DIFFERENTIAL/PLATELET
BASOS ABS: 0 10*3/uL (ref 0.0–0.1)
Basophils Relative: 1 % (ref 0–1)
EOS ABS: 0 10*3/uL (ref 0.0–0.7)
Eosinophils Relative: 0 % (ref 0–5)
HCT: 37 % — ABNORMAL LOW (ref 39.0–52.0)
HEMOGLOBIN: 12.3 g/dL — AB (ref 13.0–17.0)
Lymphocytes Relative: 50 % — ABNORMAL HIGH (ref 12–46)
Lymphs Abs: 0.4 10*3/uL — ABNORMAL LOW (ref 0.7–4.0)
MCH: 27.3 pg (ref 26.0–34.0)
MCHC: 33.2 g/dL (ref 30.0–36.0)
MCV: 82 fL (ref 78.0–100.0)
MONOS PCT: 23 % — AB (ref 3–12)
Monocytes Absolute: 0.2 10*3/uL (ref 0.1–1.0)
NEUTROS ABS: 0.2 10*3/uL — AB (ref 1.7–7.7)
Neutrophils Relative %: 26 % — ABNORMAL LOW (ref 43–77)
Platelets: 22 10*3/uL — CL (ref 150–400)
RBC: 4.51 MIL/uL (ref 4.22–5.81)
RDW: 14.3 % (ref 11.5–15.5)
SMEAR REVIEW: DECREASED
WBC: 0.7 10*3/uL — CL (ref 4.0–10.5)

## 2014-11-14 LAB — URINALYSIS, ROUTINE W REFLEX MICROSCOPIC
Glucose, UA: NEGATIVE mg/dL
KETONES UR: NEGATIVE mg/dL
Leukocytes, UA: NEGATIVE
Nitrite: NEGATIVE
SPECIFIC GRAVITY, URINE: 1.025 (ref 1.005–1.030)
Urobilinogen, UA: 2 mg/dL — ABNORMAL HIGH (ref 0.0–1.0)
pH: 5.5 (ref 5.0–8.0)

## 2014-11-14 LAB — URINE MICROSCOPIC-ADD ON

## 2014-11-14 LAB — I-STAT CG4 LACTIC ACID, ED: Lactic Acid, Venous: 1.46 mmol/L (ref 0.5–2.0)

## 2014-11-14 MED ORDER — ENALAPRIL MALEATE 5 MG PO TABS
10.0000 mg | ORAL_TABLET | Freq: Every day | ORAL | Status: DC
Start: 2014-11-15 — End: 2014-11-16
  Filled 2014-11-14 (×2): qty 2

## 2014-11-14 MED ORDER — CIPROFLOXACIN IN D5W 400 MG/200ML IV SOLN
400.0000 mg | Freq: Once | INTRAVENOUS | Status: DC
  Filled 2014-11-14: qty 200

## 2014-11-14 MED ORDER — SODIUM CHLORIDE 0.9 % IV BOLUS (SEPSIS)
1000.0000 mL | Freq: Once | INTRAVENOUS | Status: AC
  Administered 2014-11-14: 1000 mL via INTRAVENOUS

## 2014-11-14 MED ORDER — ASPIRIN 81 MG PO CHEW
81.0000 mg | CHEWABLE_TABLET | Freq: Every day | ORAL | Status: DC
  Administered 2014-11-15 – 2014-11-16 (×2): 81 mg via ORAL
  Filled 2014-11-14 (×2): qty 1

## 2014-11-14 MED ORDER — ALPRAZOLAM 0.25 MG PO TABS
0.2500 mg | ORAL_TABLET | Freq: Two times a day (BID) | ORAL | Status: DC | PRN
  Administered 2014-11-14: 0.25 mg via ORAL
  Filled 2014-11-14: qty 1

## 2014-11-14 MED ORDER — CARVEDILOL 3.125 MG PO TABS
6.2500 mg | ORAL_TABLET | Freq: Two times a day (BID) | ORAL | Status: DC
  Filled 2014-11-14 (×2): qty 2

## 2014-11-14 MED ORDER — HYDROCHLOROTHIAZIDE 12.5 MG PO CAPS
12.5000 mg | ORAL_CAPSULE | Freq: Every day | ORAL | Status: DC
  Filled 2014-11-14 (×2): qty 1

## 2014-11-14 MED ORDER — HYDROMORPHONE HCL 1 MG/ML IJ SOLN: 1.0000 mg | INTRAMUSCULAR | Status: DC | PRN

## 2014-11-14 MED ORDER — ONDANSETRON HCL 4 MG PO TABS: 4.0000 mg | ORAL_TABLET | Freq: Four times a day (QID) | ORAL | Status: DC | PRN

## 2014-11-14 MED ORDER — ONDANSETRON HCL 4 MG/2ML IJ SOLN
4.0000 mg | Freq: Once | INTRAMUSCULAR | Status: AC
  Administered 2014-11-14: 4 mg via INTRAVENOUS
  Filled 2014-11-14: qty 2

## 2014-11-14 MED ORDER — ALUM & MAG HYDROXIDE-SIMETH 200-200-20 MG/5ML PO SUSP: 30.0000 mL | Freq: Four times a day (QID) | ORAL | Status: DC | PRN

## 2014-11-14 MED ORDER — SODIUM CHLORIDE 0.9 % IJ SOLN
3.0000 mL | Freq: Two times a day (BID) | INTRAMUSCULAR | Status: DC
  Administered 2014-11-15 (×2): 3 mL via INTRAVENOUS

## 2014-11-14 MED ORDER — SODIUM CHLORIDE 0.9 % IV SOLN
INTRAVENOUS | Status: AC
  Administered 2014-11-14: 23:00:00 via INTRAVENOUS

## 2014-11-14 MED ORDER — FLUOXETINE HCL 10 MG PO CAPS
10.0000 mg | ORAL_CAPSULE | Freq: Every day | ORAL | Status: DC
  Administered 2014-11-15 – 2014-11-16 (×2): 10 mg via ORAL
  Filled 2014-11-14 (×4): qty 1

## 2014-11-14 MED ORDER — HYDROCODONE-ACETAMINOPHEN 5-325 MG PO TABS: 1.0000 | ORAL_TABLET | ORAL | Status: DC | PRN

## 2014-11-14 MED ORDER — ONDANSETRON HCL 4 MG/2ML IJ SOLN
4.0000 mg | Freq: Four times a day (QID) | INTRAMUSCULAR | Status: DC | PRN
  Administered 2014-11-14: 4 mg via INTRAVENOUS
  Filled 2014-11-14: qty 2

## 2014-11-14 MED ORDER — VANCOMYCIN HCL IN DEXTROSE 750-5 MG/150ML-% IV SOLN
750.0000 mg | Freq: Once | INTRAVENOUS | Status: AC
Start: 2014-11-14 — End: 2014-11-15
  Administered 2014-11-15: 750 mg via INTRAVENOUS
  Filled 2014-11-14: qty 150

## 2014-11-14 NOTE — ED Notes (Signed)
Pt calm, A&O x3, no concerns at this time. Urinal given for urine sample. Family at bed side.

## 2014-11-14 NOTE — ED Provider Notes (Signed)
CSN: [id number]     Arrival date & time 11/14/14  1914 History   First MD Initiated Contact with Patient 11/14/14 1932     Chief Complaint  Patient presents with  . Fall  . Weakness     (Consider location/radiation/quality/duration/timing/severity/associated sxs/prior Treatment) Patient is a 81 y.o. male presenting with fall and weakness. The history is provided by the patient (pt complains of weakness and has not been eating for a couple days.  he also fell today.   he last got chemo june 24).  Fall This is a new problem. The current episode started 1 to 2 hours ago. The problem occurs rarely. The problem has been resolved. Pertinent negatives include no chest pain, no abdominal pain and no headaches. Nothing aggravates the symptoms. Nothing relieves the symptoms.  Weakness Pertinent negatives include no chest pain, no abdominal pain and no headaches.    Past Medical History  Diagnosis Date  . HYPERLIPIDEMIA   . HYPERTENSION   . CAD   . Aortic valve disorders   . Atrial fibrillation   . CHF   . Edema   . ITP (idiopathic thrombocytopenic purpura)   . Lung cancer   . Prostate cancer    Past Surgical History  Procedure Laterality Date  . Carpal tunnel release    . Pneumonectomy    . Prostatectomy    . Liver biopsy  10/2014   Family History  Problem Relation Age of Onset  . Alzheimer's disease Father    History  Substance Use Topics  . Smoking status: Former Smoker    Types: Cigarettes    Quit date: 05/15/1982  . Smokeless tobacco: Not on file  . Alcohol Use: No    Review of Systems  Constitutional: Negative for appetite change and fatigue.  HENT: Negative for congestion, ear discharge and sinus pressure.   Eyes: Negative for discharge.  Respiratory: Negative for cough.   Cardiovascular: Negative for chest pain.  Gastrointestinal: Negative for abdominal pain and diarrhea.  Genitourinary: Negative for frequency and hematuria.  Musculoskeletal: Negative for back  pain.  Skin: Negative for rash.  Neurological: Positive for weakness. Negative for seizures and headaches.  Psychiatric/Behavioral: Negative for hallucinations.      Allergies  Penicillins and Statins  Home Medications   Prior to Admission medications   Medication Sig Start Date End Date Taking? Authorizing Provider  acetaminophen (TYLENOL ARTHRITIS PAIN) 650 MG CR tablet Take 650 mg by mouth daily.    Yes Historical Provider, MD  ALPRAZolam (XANAX) 0.25 MG tablet Take 1 tablet (0.25 mg total) by mouth 2 (two) times daily as needed for anxiety. Patient taking differently: Take 0.25 mg by mouth 2 (two) times daily.  11/09/14  Yes Chipper Herb, MD  aspirin 81 MG tablet Take 81 mg by mouth daily.     Yes Historical Provider, MD  carvedilol (COREG) 6.25 MG tablet Take 1 tablet (6.25 mg total) by mouth 2 (two) times daily with a meal. 10/30/14  Yes Kathie Dike, MD  enalapril (VASOTEC) 20 MG tablet Take 0.5 tablets (10 mg total) by mouth daily. 10/30/14  Yes Kathie Dike, MD  FLUoxetine (PROZAC) 10 MG capsule Take 1 capsule (10 mg total) by mouth daily. 10/30/14  Yes Kathie Dike, MD  hydrochlorothiazide (MICROZIDE) 12.5 MG capsule Take 12.5 mg by mouth daily. 10/29/14  Yes Historical Provider, MD  HYDROcodone-acetaminophen (NORCO/VICODIN) 5-325 MG per tablet Take 1-2 tablets by mouth every 4 (four) hours as needed for moderate pain. Patient taking differently:  Take 1 tablet by mouth 3 (three) times daily.  10/30/14  Yes Kathie Dike, MD  CARBOPLATIN IV Inject into the vein every 21 ( twenty-one) days. Day 1 every 21 days    Historical Provider, MD  ETOPOSIDE IV Inject into the vein every 21 ( twenty-one) days. Days 1,2,3 every 21 days    Historical Provider, MD  ondansetron (ZOFRAN) 8 MG tablet Take 1 tablet (8 mg total) by mouth every 8 (eight) hours as needed for nausea or vomiting. 11/04/14   Patrici Ranks, MD  Pegfilgrastim (NEULASTA ONPRO ) Inject into the skin every 21 (  twenty-one) days. Administered 27 hours after completion of chemo    Historical Provider, MD   BP 112/61 mmHg  Pulse 78  Temp(Src) 100.6 F (38.1 C) (Oral)  Resp 16  SpO2 98% Physical Exam  Constitutional: He is oriented to person, place, and time. He appears well-developed.  HENT:  Head: Normocephalic.  Eyes: Conjunctivae and EOM are normal. No scleral icterus.  Neck: Neck supple. No thyromegaly present.  Cardiovascular: Normal rate and regular rhythm.  Exam reveals no gallop and no friction rub.   No murmur heard. Pulmonary/Chest: No stridor. He has no wheezes. He has no rales. He exhibits no tenderness.  Abdominal: He exhibits no distension. There is no tenderness. There is no rebound.  Musculoskeletal: Normal range of motion. He exhibits no edema.  Lymphadenopathy:    He has no cervical adenopathy.  Neurological: He is oriented to person, place, and time. He exhibits normal muscle tone. Coordination normal.  Skin: No rash noted. No erythema.  Psychiatric: He has a normal mood and affect. His behavior is normal.    ED Course  Procedures (including critical care time) Labs Review Labs Reviewed  CBC WITH DIFFERENTIAL/PLATELET - Abnormal; Notable for the following:    WBC 0.7 (*)    Hemoglobin 12.3 (*)    HCT 37.0 (*)    Platelets 22 (*)    Neutrophils Relative % 26 (*)    Neutro Abs 0.2 (*)    Lymphocytes Relative 50 (*)    Lymphs Abs 0.4 (*)    Monocytes Relative 23 (*)    All other components within normal limits  COMPREHENSIVE METABOLIC PANEL - Abnormal; Notable for the following:    Sodium 128 (*)    Chloride 86 (*)    Glucose, Bld 125 (*)    BUN 29 (*)    Calcium 8.4 (*)    Total Protein 6.0 (*)    Albumin 3.2 (*)    Alkaline Phosphatase 199 (*)    Total Bilirubin 2.3 (*)    GFR calc non Af Amer 56 (*)    All other components within normal limits  URINALYSIS, ROUTINE W REFLEX MICROSCOPIC (NOT AT [facility]) - Abnormal; Notable for the following:    Color, Urine  AMBER (*)    APPearance HAZY (*)    Hgb urine dipstick TRACE (*)    Bilirubin Urine SMALL (*)    Protein, ur TRACE (*)    Urobilinogen, UA 2.0 (*)    All other components within normal limits  URINE MICROSCOPIC-ADD ON - Abnormal; Notable for the following:    Bacteria, UA MANY (*)    All other components within normal limits  CULTURE, BLOOD (ROUTINE X 2)  CULTURE, BLOOD (ROUTINE X 2)  URINE CULTURE  I-STAT CG4 LACTIC ACID, ED    Imaging Review Dg Chest Portable 1 View  11/14/2014   CLINICAL DATA:  Short of  breath. Fall 3 times today. Right pneumonectomy with bone and hepatic metastasis.  EXAM: PORTABLE CHEST - 1 VIEW  COMPARISON:  CT of 11/09/2014 and plain film 10/27/2014  FINDINGS: Right-sided pneumonectomy, with tracheal deviation to the left. No air within the pneumonectomy space. No left-sided pleural fluid or pneumothorax. Probable mild cardiomegaly. Clear left lung. Numerous leads and wires project over the chest.  IMPRESSION: Status post right pneumonectomy, without acute superimposed process or explanation for shortness of breath.  No posttraumatic deformity identified.   Electronically Signed   By: Abigail Miyamoto M.D.   On: 11/14/2014 20:55     EKG Interpretation   Date/Time:  Saturday November 14 2014 19:39:40 EDT Ventricular Rate:  79 PR Interval:  199 QRS Duration: 117 QT Interval:  401 QTC Calculation: 460 R Axis:   -53 Text Interpretation:  Sinus rhythm Incomplete left bundle branch block  Confirmed by Khristen Cheyney  MD, Johntavius Shepard [phone]) on 11/14/2014 9:28:17 PM      MDM   Final diagnoses:  UTI (lower urinary tract infection)  Leukopenia    Admit for leukopenia,  Fever and uti    Milton Ferguson, MD 11/14/14 2129

## 2014-11-14 NOTE — ED Notes (Signed)
CRITICAL VALUE ALERT  Critical value received:  Wbc 0.7  Date of notification:  11/14/14  Time of notification:  2052  Critical value read back:Yes.    Nurse who received alert:  [facility]  MD notified (1st page):  Zammit  Time of first page:  2052  MD notified (2nd page):  Time of second page:  Responding MD:  Roderic Palau  Time MD responded:  2052

## 2014-11-14 NOTE — H&P (Signed)
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  No injuries from falls but his sons were concerned.  Pt has had his 3rd round of chemo and is not doing very well with it.  Not eating well.  Just wants to lie around all the time.  Denies any n/v.  No diarrhea.  No fevers.  No cough.  No dysuria.  No recent acute illnesses.  In ED found to have fever and neutropenia.  Both of his sons, only children are present with him today in the ED.  He is loosing weight and has no appetite since he started chemotherapy.  He lives with his wife.  No cough.    Review of Systems:  Positive and negative as per HPI otherwise all other systems are negative  Past Medical History: Past Medical History  Diagnosis Date  . HYPERLIPIDEMIA   . HYPERTENSION   . CAD   . Aortic valve disorders   . Atrial fibrillation   . CHF   . Edema   . ITP (idiopathic thrombocytopenic purpura)   . Lung cancer   . Prostate cancer    Past Surgical History  Procedure Laterality Date  . Carpal tunnel release    . Pneumonectomy    . Prostatectomy    . Liver biopsy  10/2014    Medications: Prior to Admission medications   Medication Sig Start Date End Date Taking? Authorizing Provider  acetaminophen (TYLENOL ARTHRITIS PAIN) 650 MG CR tablet Take 650 mg by mouth daily.    Yes Historical Provider, MD  ALPRAZolam (XANAX) 0.25 MG tablet Take 1 tablet (0.25 mg total) by mouth 2 (two) times daily as needed for anxiety. Patient taking differently: Take 0.25 mg by mouth 2 (two) times daily.  11/09/14  Yes Chipper Herb, MD  aspirin 81 MG tablet Take 81 mg by mouth daily.     Yes Historical Provider, MD  carvedilol (COREG) 6.25 MG tablet Take 1 tablet (6.25 mg total) by mouth 2 (two) times daily with a meal. 10/30/14  Yes Kathie Dike, MD   enalapril (VASOTEC) 20 MG tablet Take 0.5 tablets (10 mg total) by mouth daily. 10/30/14  Yes Kathie Dike, MD  FLUoxetine (PROZAC) 10 MG capsule Take 1 capsule (10 mg total) by mouth daily. 10/30/14  Yes Kathie Dike, MD  hydrochlorothiazide (MICROZIDE) 12.5 MG capsule Take 12.5 mg by mouth daily. 10/29/14  Yes Historical Provider, MD  HYDROcodone-acetaminophen (NORCO/VICODIN) 5-325 MG per tablet Take 1-2 tablets by mouth every 4 (four) hours as needed for moderate pain. Patient taking differently: Take 1 tablet by mouth 3 (three) times daily.  10/30/14  Yes Kathie Dike, MD  CARBOPLATIN IV Inject into the vein every 21 ( twenty-one) days. Day 1 every 21 days    Historical Provider, MD  ETOPOSIDE IV Inject into the vein every 21 ( twenty-one) days. Days 1,2,3 every 21 days    Historical Provider, MD  ondansetron (ZOFRAN) 8 MG tablet Take 1 tablet (8 mg total) by mouth every 8 (eight) hours as needed for nausea or vomiting. 11/04/14   Patrici Ranks, MD  Pegfilgrastim (NEULASTA ONPRO [place]) Inject into the skin every 21 ( twenty-one) days. Administered 27 hours after completion of chemo    Historical Provider, MD    Allergies:  Allergies  Allergen Reactions  . Penicillins Other (See Comments)    Childhood reaction   . Statins Other (See Comments)    myalgias    Social History:  reports that he quit smoking about 32 years ago. His smoking use included Cigarettes. He does not have any smokeless tobacco history on file. He reports that he does not drink alcohol or use illicit drugs.  Family History: Family History  Problem Relation Age of Onset  . Alzheimer's disease Father     Physical Exam: Filed Vitals:   11/14/14 1923 11/14/14 2000 11/14/14 2030 11/14/14 2130  BP: 112/61   133/72  Pulse: 84 79 78 83  Temp: 100.6 F (38.1 C)     TempSrc: Oral     Resp: '16 19 16 24  '$ SpO2: 98% 98% 98% 98%   General appearance: alert, cooperative and no distress Head: Normocephalic, without  obvious abnormality, atraumatic Eyes: negative Nose: Nares normal. Septum midline. Mucosa normal. No drainage or sinus tenderness. Neck: no JVD and supple, symmetrical, trachea midline Lungs: clear to auscultation bilaterally Heart: regular rate and rhythm, S1, S2 normal, no murmur, click, rub or gallop Abdomen: soft, non-tender; bowel sounds normal; no masses,  no organomegaly Extremities: extremities normal, atraumatic, no cyanosis or edema Pulses: 2+ and symmetric Skin: Skin color, texture, turgor normal. No rashes or lesions Neurologic: Grossly normal    Labs on Admission:   Recent Labs  11/14/14 1941  NA 128*  K 3.9  CL 86*  CO2 31  GLUCOSE 125*  BUN 29*  CREATININE 1.19  CALCIUM 8.4*    Recent Labs  11/14/14 1941  AST 35  ALT 33  ALKPHOS 199*  BILITOT 2.3*  PROT 6.0*  ALBUMIN 3.2*    Recent Labs  11/14/14 1941  WBC 0.7*  NEUTROABS 0.2*  HGB 12.3*  HCT 37.0*  MCV 82.0  PLT 22*    Radiological Exams on Admission: Dg Chest Portable 1 View  11/14/2014   CLINICAL DATA:  Short of breath. Fall 3 times today. Right pneumonectomy with bone and hepatic metastasis.  EXAM: PORTABLE CHEST - 1 VIEW  COMPARISON:  CT of 11/09/2014 and plain film 10/27/2014  FINDINGS: Right-sided pneumonectomy, with tracheal deviation to the left. No air within the pneumonectomy space. No left-sided pleural fluid or pneumothorax. Probable mild cardiomegaly. Clear left lung. Numerous leads and wires project over the chest.  IMPRESSION: Status post right pneumonectomy, without acute superimposed process or explanation for shortness of breath.  No posttraumatic deformity identified.   Electronically Signed   By: Abigail Miyamoto M.D.   On: 11/14/2014 20:55   Old chart reviewed Case discussed with dr zammit edp cxr reviewed no infiltrate no edema  Assessment/Plan  81 yo male with neutropenic fever, unclear source on chemo for stage 4 small cell carcinoma  Principal Problem:   Neutropenic  fever-  Possible urine.  Cover with iv zosyn and vancomycin.  Blood cultures and urine culture has been ordered.  Does not look toxic.  Gentle ivf overnight.  Broad abx until cx results back.    Active Problems:   HTN (hypertension)- stable   Atrial fibrillation- stable   Chronic systolic congestive heart failure-  Stable, compensated at this time   Hepatic metastases   Small cell carcinoma  Admit to tele bed.  Have had discussion about his code status with him and his 2 sons.  Pt wishes no aggressive measures in the future.  He understands his cancer in noncurative and terminal.  He wishes no cpr or intubation under no circumstances in the future.  He wishes to be DNR.  He does wish for abx and ivf if needed at this time.  Consider trial of megace at discharge for his appetite.  PT eval also prior to d/c.    DAVID,RACHAL A 11/14/2014, 10:06 PM

## 2014-11-14 NOTE — ED Notes (Signed)
CRITICAL VALUE ALERT  Critical value received:  plt 22  Date of notification:  11/14/14  Time of notification:  2052  Critical value read back:Yes.    Nurse who received alert:  [facility]  MD notified (1st page):  Zammit  Time of first page:  2052  MD notified (2nd page):  Time of second page:  Responding MD:  Roderic Palau  Time MD responded:  2052

## 2014-11-14 NOTE — ED Notes (Signed)
Pt. Reports falling 3 times today. Pt. Reports generalized weakness and poor appetite. Pt. Is currently on chemo treatments.

## 2014-11-15 ENCOUNTER — Encounter (HOSPITAL_COMMUNITY): Payer: Self-pay | Admitting: *Deleted

## 2014-11-15 DIAGNOSIS — D709 Neutropenia, unspecified: Secondary | ICD-10-CM

## 2014-11-15 DIAGNOSIS — C801 Malignant (primary) neoplasm, unspecified: Secondary | ICD-10-CM

## 2014-11-15 DIAGNOSIS — C787 Secondary malignant neoplasm of liver and intrahepatic bile duct: Secondary | ICD-10-CM

## 2014-11-15 LAB — CBC
HCT: 30.8 % — ABNORMAL LOW (ref 39.0–52.0)
HEMOGLOBIN: 10.3 g/dL — AB (ref 13.0–17.0)
MCH: 27.5 pg (ref 26.0–34.0)
MCHC: 33.4 g/dL (ref 30.0–36.0)
MCV: 82.1 fL (ref 78.0–100.0)
PLATELETS: 14 10*3/uL — AB (ref 150–400)
RBC: 3.75 MIL/uL — AB (ref 4.22–5.81)
RDW: 14.2 % (ref 11.5–15.5)
WBC: 1.4 10*3/uL — AB (ref 4.0–10.5)

## 2014-11-15 LAB — BASIC METABOLIC PANEL
ANION GAP: 9 (ref 5–15)
BUN: 24 mg/dL — AB (ref 6–20)
CHLORIDE: 90 mmol/L — AB (ref 101–111)
CO2: 31 mmol/L (ref 22–32)
CREATININE: 0.95 mg/dL (ref 0.61–1.24)
Calcium: 7.9 mg/dL — ABNORMAL LOW (ref 8.9–10.3)
GFR calc non Af Amer: >60 mL/min (ref >60)
GLUCOSE: 117 mg/dL — AB (ref 65–99)
POTASSIUM: 3.4 mmol/L — AB (ref 3.5–5.1)
Sodium: 130 mmol/L — ABNORMAL LOW (ref 135–145)

## 2014-11-15 LAB — TYPE AND SCREEN
ABO/RH(D): A POS
Antibody Screen: NEGATIVE

## 2014-11-15 MED ORDER — ENSURE ENLIVE PO LIQD
237.0000 mL | Freq: Two times a day (BID) | ORAL | Status: DC
  Administered 2014-11-15: 237 mL via ORAL

## 2014-11-15 MED ORDER — VANCOMYCIN HCL 10 G IV SOLR
1500.0000 mg | Freq: Once | INTRAVENOUS | Status: AC
  Administered 2014-11-15: 1500 mg via INTRAVENOUS
  Filled 2014-11-15: qty 1500

## 2014-11-15 MED ORDER — VANCOMYCIN HCL IN DEXTROSE 750-5 MG/150ML-% IV SOLN
INTRAVENOUS | Status: AC
  Filled 2014-11-15: qty 150

## 2014-11-15 MED ORDER — VANCOMYCIN HCL IN DEXTROSE 1-5 GM/200ML-% IV SOLN
1000.0000 mg | Freq: Two times a day (BID) | INTRAVENOUS | Status: DC
  Filled 2014-11-15 (×4): qty 200

## 2014-11-15 MED ORDER — SODIUM CHLORIDE 0.9 % IV SOLN
Freq: Once | INTRAVENOUS | Status: AC
  Administered 2014-11-15: 13:00:00 via INTRAVENOUS

## 2014-11-15 MED ORDER — DEXTROSE 5 % IV SOLN
2.0000 g | Freq: Once | INTRAVENOUS | Status: AC
  Administered 2014-11-15: 2 g via INTRAVENOUS
  Filled 2014-11-15: qty 2

## 2014-11-15 MED ORDER — DEXTROSE 5 % IV SOLN
2.0000 g | Freq: Three times a day (TID) | INTRAVENOUS | Status: DC
  Administered 2014-11-15: 2 g via INTRAVENOUS
  Filled 2014-11-15 (×7): qty 2

## 2014-11-15 MED ORDER — DEXTROSE 5 % IV SOLN
INTRAVENOUS | Status: AC
  Filled 2014-11-15: qty 2

## 2014-11-15 MED ORDER — DEXTROSE 5 % IV SOLN
1.0000 g | Freq: Three times a day (TID) | INTRAVENOUS | Status: DC
  Filled 2014-11-15 (×4): qty 1

## 2014-11-15 NOTE — Progress Notes (Signed)
After a long conversation with the patient.  He states that he has decided to stop his chem since he feels that it has not done him any good.  Plus he feels as he is a burden to his family as he stated how he hates his sons take off of work to take him back and forth from chemo.  He stated that he wants to talk to someone about leaving in the morning since his sons are off and can pick him up.  He verbalizes that the chemo has made him feel worse than before he started the medication and wishes to end the medication and go home.  Notified Dr. Rogue Bussing of the patients wishes and he stated to continue to encourage him to take his medications and to let the am MD know.  I verbalized understanding.

## 2014-11-15 NOTE — Progress Notes (Signed)
ANTIBIOTIC CONSULT NOTE - INITIAL  Pharmacy Consult for Vancomycin and Fortaz Indication: febrile neutropenia  Allergies  Allergen Reactions  . Penicillins Other (See Comments)    Childhood reaction   . Statins Other (See Comments)    myalgias   Patient Measurements: Height: '5\' 8"'$  (172.7 cm) Weight: 159 lb 6.4 oz (72.303 kg) IBW/kg (Calculated) : 68.4  Vital Signs: Temp: 99.4 F (37.4 C) (07/03 0636) Temp Source: Oral (07/03 0636) BP: 109/60 mmHg (07/03 0636) Pulse Rate: 80 (07/03 0636) Intake/Output from previous day: 07/02 0701 - 07/03 0700 In: 727.5 [I.V.:527.5; IV Piggyback:200] Out: 401 [Urine:400; Stool:1] Intake/Output from this shift:    Labs:  Recent Labs  11/14/14 1941 11/15/14 0557  WBC 0.7* 1.4*  HGB 12.3* 10.3*  PLT 22* 14*  CREATININE 1.19 0.95   Estimated Creatinine Clearance: 60 mL/min (by C-G formula based on Cr of 0.95). No results for input(s): VANCOTROUGH, VANCOPEAK, VANCORANDOM, GENTTROUGH, GENTPEAK, GENTRANDOM, TOBRATROUGH, TOBRAPEAK, TOBRARND, AMIKACINPEAK, AMIKACINTROU, AMIKACIN in the last 72 hours.   Microbiology: Recent Results (from the past 720 hour(s))  Urine culture     Status: None   Collection Time: 10/27/14 10:51 AM  Result Value Ref Range Status   Urine Culture, Routine Final report  Final   Result 1 Comment  Final    Comment: Mixed urogenital flora Less than 10,000 colonies/mL   Blood culture (routine x 2)     Status: None (Preliminary result)   Collection Time: 11/14/14  7:50 PM  Result Value Ref Range Status   Specimen Description RIGHT ANTECUBITAL  Final   Special Requests BOTTLES DRAWN AEROBIC ONLY 6CC  Final   Culture NO GROWTH < 12 HOURS  Final   Report Status PENDING  Incomplete  Blood culture (routine x 2)     Status: None (Preliminary result)   Collection Time: 11/14/14  7:56 PM  Result Value Ref Range Status   Specimen Description LEFT ANTECUBITAL  Final   Special Requests BOTTLES DRAWN AEROBIC ONLY 6CC   Final   Culture NO GROWTH < 12 HOURS  Final   Report Status PENDING  Incomplete   Medical History: Past Medical History  Diagnosis Date  . HYPERLIPIDEMIA   . HYPERTENSION   . CAD   . Aortic valve disorders   . Atrial fibrillation   . CHF   . Edema   . ITP (idiopathic thrombocytopenic purpura)   . Lung cancer   . Prostate cancer    Anti-infectives    Start     Dose/Rate Route Frequency Ordered Stop   11/15/14 2100  vancomycin (VANCOCIN) IVPB 1000 mg/200 mL premix     1,000 mg 200 mL/hr over 60 Minutes Intravenous Every 12 hours 11/15/14 0922     11/15/14 1030  vancomycin (VANCOCIN) 1,500 mg in sodium chloride 0.9 % 500 mL IVPB     1,500 mg 250 mL/hr over 120 Minutes Intravenous  Once 11/15/14 0824     11/15/14 1000  cefTAZidime (FORTAZ) 1 g in dextrose 5 % 50 mL IVPB  Status:  Discontinued     1 g 100 mL/hr over 30 Minutes Intravenous Every 8 hours 11/15/14 0824 11/15/14 0921   11/15/14 1000  cefTAZidime (FORTAZ) 2 g in dextrose 5 % 50 mL IVPB     2 g 100 mL/hr over 30 Minutes Intravenous Every 8 hours 11/15/14 0921     11/15/14 0045  cefTAZidime (FORTAZ) 2 g in dextrose 5 % 50 mL IVPB     2 g 100 mL/hr  over 30 Minutes Intravenous  Once 11/15/14 0031 11/15/14 0223   11/14/14 2345  vancomycin (VANCOCIN) IVPB 750 mg/150 ml premix     750 mg 150 mL/hr over 60 Minutes Intravenous  Once 11/14/14 2331 11/15/14 0336   11/14/14 2115  ciprofloxacin (CIPRO) IVPB 400 mg  Status:  Discontinued     400 mg 200 mL/hr over 60 Minutes Intravenous  Once 11/14/14 2106 11/14/14 2250     Assessment: 81 yo male with hx of metastatic small cell carcinoma, s/p recent 3rd round chemotherapy. Pt has fever, WBC 0.7 and will be given broad spectrum antibiotics. Thrombocytopenia noted, SCr at baseline  Goal of Therapy:  Vancomycin trough level 15-20 mcg/ml  Plan:  Ceftazidime 2gm IV q8h Vancomycin '1500mg'$  IV now x 1 then Vancomycin '1000mg'$  IV q12hrs Check trough at steady state Monitor labs,  renal fxn, c/s  [place] Crane, Bryonna Sundby A 11/15/2014,10:50 AM

## 2014-11-15 NOTE — Progress Notes (Signed)
Notified Dr. Jerilee Hoh that throughout the day the patients bp has been low SBP in the 90's.  Plus his HR has been in the early 60's for the most part.  Voiced to her that I had held his cardiac meds today and to call me if she still wants the patient to get the Coreg for the evening dose.  The patient has no complaints of discomfort at this time.  I will continue to monitor him.

## 2014-11-15 NOTE — Plan of Care (Signed)
Problem: Phase I Progression Outcomes Goal: Pain controlled with appropriate interventions Outcome: Progressing No c/o pain at this time.

## 2014-11-15 NOTE — Progress Notes (Signed)
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 Whitney Muse. -Has had no further temperatures since admission at which time his fever was of 100.6. -Source of infection has not been immediately apparent, chest x-ray and UA are negative for source of infection. -Blood cultures have been drawn and are so far negative to date. -For now continue broad-spectrum antibiotics consisting of vancomycin and ceftazidime. -As per discussion with Dr. Whitney Muse, received Neulasta with last chemotherapy, no need for repeat  Dose.  Pancytopenia -Secondary to ongoing chemotherapy for small cell cancer. -As per direction of oncology, will transfuse 1 unit of platelets today.  Hypertension -Stable.  Atrial fibrillation -Rate controlled.  Code Status: DO NOT RESUSCITATE Family Communication: Patient only  Disposition Plan: Home when ready, anticipate 48-72 hours   Consultants:  Dr. Whitney Muse, oncology via telephone   Antibiotics:  Vancomycin  Ceftaz edema   Subjective: Feels "wiped out", no energy, no specific complaints  Objective: Filed Vitals:   11/14/14 2130 11/14/14 2200 11/14/14 2231 11/15/14 0636  BP: 133/72 117/65 111/62 109/60  Pulse: 83 82 82 80  Temp:   100.3 F (37.9 C) 99.4 F (37.4 C)  TempSrc:   Oral Oral  Resp: '24 28 21   '$ Height:   '5\' 8"'$  (1.727 m)   Weight:   72.303 kg (159 lb 6.4 oz) 72.303 kg (159 lb 6.4 oz)  SpO2: 98% 99% 97% 95%    Intake/Output Summary (Last 24 hours) at 11/15/14 1159 Last data filed at 11/15/14 0611  Gross per 24 hour  Intake  727.5 ml  Output    401 ml  Net  326.5 ml   Filed Weights   11/14/14 2231 11/15/14 0636  Weight: 72.303 kg (159 lb 6.4 oz) 72.303 kg (159 lb 6.4 oz)    Exam:   General:  Alert, awake,  oriented 3  Cardiovascular: Regular rate and rhythm  Respiratory: Clear to auscultation bilaterally  Abdomen: Soft, nontender, nondistended, positive bowel sounds  Extremities: No clubbing, cyanosis or edema, positive pulses   Neurologic:  Nonfocal  Data Reviewed: Basic Metabolic Panel:  Recent Labs Lab 11/14/14 1941 11/15/14 0557  NA 128* 130*  K 3.9 3.4*  CL 86* 90*  CO2 31 31  GLUCOSE 125* 117*  BUN 29* 24*  CREATININE 1.19 0.95  CALCIUM 8.4* 7.9*   Liver Function Tests:  Recent Labs Lab 11/14/14 1941  AST 35  ALT 33  ALKPHOS 199*  BILITOT 2.3*  PROT 6.0*  ALBUMIN 3.2*   No results for input(s): LIPASE, AMYLASE in the last 168 hours. No results for input(s): AMMONIA in the last 168 hours. CBC:  Recent Labs Lab 11/14/14 1941 11/15/14 0557  WBC 0.7* 1.4*  NEUTROABS 0.2*  --   HGB 12.3* 10.3*  HCT 37.0* 30.8*  MCV 82.0 82.1  PLT 22* 14*   Cardiac Enzymes: No results for input(s): CKTOTAL, CKMB, CKMBINDEX, TROPONINI in the last 168 hours. BNP (last 3 results) No results for input(s): BNP in the last 8760 hours.  ProBNP (last 3 results) No results for input(s): PROBNP in the last 8760 hours.  CBG: No results for input(s): GLUCAP in the last 168 hours.  Recent Results (from the past 240 hour(s))  Blood culture (routine x 2)     Status: None (Preliminary result)   Collection Time: 11/14/14  7:50 PM  Result Value Ref Range Status   Specimen Description RIGHT ANTECUBITAL  Final   Special Requests BOTTLES DRAWN AEROBIC ONLY 6CC  Final   Culture NO GROWTH < 12 HOURS  Final   Report Status PENDING  Incomplete  Blood culture (routine x 2)     Status: None (Preliminary result)   Collection Time: 11/14/14  7:56 PM  Result Value Ref Range Status   Specimen Description LEFT ANTECUBITAL  Final   Special Requests BOTTLES DRAWN AEROBIC ONLY 6CC  Final   Culture NO GROWTH < 12 HOURS  Final   Report Status PENDING  Incomplete     Studies: Dg Chest  Portable 1 View  11/14/2014   CLINICAL DATA:  Short of breath. Fall 3 times today. Right pneumonectomy with bone and hepatic metastasis.  EXAM: PORTABLE CHEST - 1 VIEW  COMPARISON:  CT of 11/09/2014 and plain film 10/27/2014  FINDINGS: Right-sided pneumonectomy, with tracheal deviation to the left. No air within the pneumonectomy space. No left-sided pleural fluid or pneumothorax. Probable mild cardiomegaly. Clear left lung. Numerous leads and wires project over the chest.  IMPRESSION: Status post right pneumonectomy, without acute superimposed process or explanation for shortness of breath.  No posttraumatic deformity identified.   Electronically Signed   By: Abigail Miyamoto M.D.   On: 11/14/2014 20:55    Scheduled Meds: . sodium chloride   Intravenous Once  . aspirin  81 mg Oral Daily  . carvedilol  6.25 mg Oral BID WC  . cefTAZidime (FORTAZ)  IV  2 g Intravenous Q8H  . enalapril  10 mg Oral Daily  . feeding supplement (ENSURE ENLIVE)  237 mL Oral BID BM  . FLUoxetine  10 mg Oral Daily  . hydrochlorothiazide  12.5 mg Oral Daily  . sodium chloride  3 mL Intravenous Q12H  . vancomycin  1,500 mg Intravenous Once  . vancomycin  1,000 mg Intravenous Q12H   Continuous Infusions:   Principal Problem:   Neutropenic fever Active Problems:   HTN (hypertension)   Atrial fibrillation   Chronic systolic congestive heart failure   Hepatic metastases   Small cell carcinoma    Time spent: 30 minutes. Greater than 50% of this time was spent in direct contact with the patient coordinating care.    Lelon Frohlich  [facility] Pager [phone]  If 7PM-7AM, please contact night-coverage at [email or web address], password [facility] 11/15/2014, 11:59 AM  LOS: 1 day

## 2014-11-15 NOTE — Progress Notes (Signed)
ANTIBIOTIC CONSULT NOTE-Preliminary  Pharmacy Consult for Vancomycin and Ceftazidime Indication: Febrile neutropenia  Allergies  Allergen Reactions  . Penicillins Other (See Comments)    Childhood reaction   . Statins Other (See Comments)    myalgias    Patient Measurements:    Vital Signs: Temp: 100.6 F (38.1 C) (07/02 1923) Temp Source: Oral (07/02 1923) BP: 117/65 mmHg (07/02 2200) Pulse Rate: 82 (07/02 2200)  Labs:  Recent Labs  11/14/14 1941  WBC 0.7*  HGB 12.3*  PLT 22*  CREATININE 1.19    Estimated Creatinine Clearance: 47.9 mL/min (by C-G formula based on Cr of 1.19).  No results for input(s): VANCOTROUGH, VANCOPEAK, VANCORANDOM, GENTTROUGH, GENTPEAK, GENTRANDOM, TOBRATROUGH, TOBRAPEAK, TOBRARND, AMIKACINPEAK, AMIKACINTROU, AMIKACIN in the last 72 hours.   Microbiology: Recent Results (from the past 720 hour(s))  Urine culture     Status: None   Collection Time: 10/27/14 10:51 AM  Result Value Ref Range Status   Urine Culture, Routine Final report  Final   Result 1 Comment  Final    Comment: Mixed urogenital flora Less than 10,000 colonies/mL   Blood culture (routine x 2)     Status: None (Preliminary result)   Collection Time: 11/14/14  7:50 PM  Result Value Ref Range Status   Specimen Description RIGHT ANTECUBITAL  Final   Special Requests BOTTLES DRAWN AEROBIC ONLY 6CC  Final   Culture PENDING  Incomplete   Report Status PENDING  Incomplete  Blood culture (routine x 2)     Status: None (Preliminary result)   Collection Time: 11/14/14  7:56 PM  Result Value Ref Range Status   Specimen Description LEFT ANTECUBITAL  Final   Special Requests BOTTLES DRAWN AEROBIC ONLY [place]  Final   Culture PENDING  Incomplete   Report Status PENDING  Incomplete    Medical History: Past Medical History  Diagnosis Date  . HYPERLIPIDEMIA   . HYPERTENSION   . CAD   . Aortic valve disorders   . Atrial fibrillation   . CHF   . Edema   . ITP (idiopathic  thrombocytopenic purpura)   . Lung cancer   . Prostate cancer     Medications:  Ciprofloxacin 400 mg IV x 1 dose given in the ED  Assessment: 81 yo male with hx of metastatic small cell carcinoma, s/p recent 3rd round chemotherapy. Pt has fever, WBC 0.7 and will be given broad spectrum antibiotics.  Goal of Therapy:  Vancomycin troughs 15-20 mcg/ml  Plan:  Preliminary review of pertinent patient information completed.  Protocol will be initiated with one-time doses of Vancomycin 750 mg IV and Ceftazidime 2 Gm IV.  Forestine Na clinical pharmacist will complete review during morning rounds to assess patient and finalize treatment regimen.  Norberto Sorenson, [facility] 11/15/2014,12:32 AM

## 2014-11-16 LAB — BASIC METABOLIC PANEL
ANION GAP: 8 (ref 5–15)
BUN: 19 mg/dL (ref 6–20)
CO2: 31 mmol/L (ref 22–32)
Calcium: 8 mg/dL — ABNORMAL LOW (ref 8.9–10.3)
Chloride: 92 mmol/L — ABNORMAL LOW (ref 101–111)
Creatinine, Ser: 0.78 mg/dL (ref 0.61–1.24)
GFR calc Af Amer: >60 mL/min (ref >60)
GFR calc non Af Amer: >60 mL/min (ref >60)
Glucose, Bld: 83 mg/dL (ref 65–99)
Potassium: 3.2 mmol/L — ABNORMAL LOW (ref 3.5–5.1)
Sodium: 131 mmol/L — ABNORMAL LOW (ref 135–145)

## 2014-11-16 LAB — CBC
HEMATOCRIT: 28.7 % — AB (ref 39.0–52.0)
HEMOGLOBIN: 9.8 g/dL — AB (ref 13.0–17.0)
MCH: 27.8 pg (ref 26.0–34.0)
MCHC: 34.1 g/dL (ref 30.0–36.0)
MCV: 81.5 fL (ref 78.0–100.0)
PLATELETS: 29 10*3/uL — AB (ref 150–400)
RBC: 3.52 MIL/uL — ABNORMAL LOW (ref 4.22–5.81)
RDW: 14.2 % (ref 11.5–15.5)
WBC: 3 10*3/uL — ABNORMAL LOW (ref 4.0–10.5)

## 2014-11-16 LAB — PREPARE PLATELET PHERESIS: Unit division: 0

## 2014-11-16 MED ORDER — POTASSIUM CHLORIDE CRYS ER 20 MEQ PO TBCR
40.0000 meq | EXTENDED_RELEASE_TABLET | Freq: Once | ORAL | Status: AC
  Administered 2014-11-16: 40 meq via ORAL
  Filled 2014-11-16: qty 2

## 2014-11-16 MED ORDER — LEVOFLOXACIN 750 MG PO TABS: 750.0000 mg | ORAL_TABLET | Freq: Every day | ORAL | Status: AC

## 2014-11-16 MED ORDER — LEVOFLOXACIN 750 MG PO TABS: 750.0000 mg | ORAL_TABLET | Freq: Every day | ORAL | Status: DC

## 2014-11-16 NOTE — Care Management (Signed)
Important Message  Patient Details  Name: Craig Dean MRN: [id number] Date of Birth: August 19, 1931   Medicare Important Message Given:  N/A - LOS <3 / Initial given by admissions    Sherald Barge, RN 11/16/2014, 12:05 PM

## 2014-11-16 NOTE — Care Management Note (Signed)
Case Management Note  Patient Details  Name: Craig Dean MRN: [id number] Date of Birth: 09-01-1931   Expected Discharge Date:                  Expected Discharge Plan:  Home/Self Care  In-House Referral:  NA  Discharge planning Services  CM Consult  Post Acute Care Choice:  NA Choice offered to:  NA  DME Arranged:    DME Agency:     HH Arranged:    [place] Agency:     Status of Service:  Completed, signed off  Medicare Important Message Given:    Date Medicare IM Given:    Medicare IM give by:    Date Additional Medicare IM Given:    Additional Medicare Important Message give by:     If discussed at [place] of Stay Meetings, dates discussed:    Additional Comments: Patient is from home and independent at baseline. Pt has decided he wants to discontinue all cancer treatments. Patient discharging home today and will follow up with oncologist to discuss patient's wishes. No CM needs at this time.  Sherald Barge, RN 11/16/2014, 12:01 PM

## 2014-11-16 NOTE — Discharge Summary (Signed)
Physician Discharge Summary  Craig Dean VXB:[id number] DOB: May 09, 1932 DOA: 11/14/2014  PCP: Redge Gainer, MD  Admit date: 11/14/2014 Discharge date: 11/16/2014  Time spent: 45 minutes  Recommendations for Outpatient Follow-up:  -Will be discharged home today as patient is insisting that he would not stay in the hospital any longer. Will not allow him to leave AMA as I believe he needs the prescription for antibiotics. -Advised to follow-up with Dr. Whitney Muse in the cancer center in 1 week.  Discharge Diagnoses:  Principal Problem:   Neutropenic fever Active Problems:   HTN (hypertension)   Atrial fibrillation   Chronic systolic congestive heart failure   Hepatic metastases   Small cell carcinoma   Discharge Condition: Stable  Filed Weights   11/14/14 2231 11/15/14 0636 11/16/14 0623  Weight: 72.303 kg (159 lb 6.4 oz) 72.303 kg (159 lb 6.4 oz) 74 kg (163 lb 2.3 oz)    History of present illness:  81 yo male metastatic cancer small cell carcinoma stage 4, htn, afib, chf comes in for over one day of overall generalized weakness and freq falls today. No injuries from falls but his sons were concerned. Pt has had his 3rd round of chemo and is not doing very well with it. Not eating well. Just wants to lie around all the time. Denies any n/v. No diarrhea. No fevers. No cough. No dysuria. No recent acute illnesses. In ED found to have fever and neutropenia. Both of his sons, only children are present with him today in the ED. He is loosing weight and has no appetite since he started chemotherapy. He lives with his wife. No cough.   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 -Has been afebrile 36 hours. Source of infection is not apparent, blood cultures have remained negative to date, chest x-ray and urinalysis without evidence of infection. -At patient's insistence that he  will be discharging today, will go ahead and prescribe him a seven-day course of Levaquin. -Advised to follow-up with Dr. Whitney Muse in one week.  Pancytopenia -Chemotherapy-induced. -Was given 1 unit of platelets on 7/3 for a platelet count of 14, it has risen to 29. No signs of active bleeding.  Procedures:  None   Consultations:  None  Discharge Instructions  Discharge Instructions    Increase activity slowly    Complete by:  As directed             Medication List    STOP taking these medications        CARBOPLATIN IV     enalapril 20 MG tablet  Commonly known as:  VASOTEC     ETOPOSIDE IV     hydrochlorothiazide 12.5 MG capsule  Commonly known as:  MICROZIDE     NEULASTA ONPRO [place]      TAKE these medications        ALPRAZolam 0.25 MG tablet  Commonly known as:  XANAX  Take 1 tablet (0.25 mg total) by mouth 2 (two) times daily as needed for anxiety.     aspirin 81 MG tablet  Take 81 mg by mouth daily.     carvedilol 6.25 MG tablet  Commonly known as:  COREG  Take 1 tablet (6.25 mg total) by mouth 2 (two) times daily with a meal.     FLUoxetine 10 MG capsule  Commonly known as:  PROZAC  Take 1 capsule (10 mg total) by mouth daily.  HYDROcodone-acetaminophen 5-325 MG per tablet  Commonly known as:  NORCO/VICODIN  Take 1-2 tablets by mouth every 4 (four) hours as needed for moderate pain.     levofloxacin 750 MG tablet  Commonly known as:  LEVAQUIN  Take 1 tablet (750 mg total) by mouth daily.     ondansetron 8 MG tablet  Commonly known as:  ZOFRAN  Take 1 tablet (8 mg total) by mouth every 8 (eight) hours as needed for nausea or vomiting.     TYLENOL ARTHRITIS PAIN 650 MG CR tablet  Generic drug:  acetaminophen  Take 650 mg by mouth daily.       Allergies  Allergen Reactions  . Penicillins Other (See Comments)    Childhood reaction   . Statins Other (See Comments)    myalgias       Follow-up Information    Follow up with  Molli Hazard, MD. Schedule an appointment as soon as possible for a visit in 1 week.   Specialties:  Hematology and Oncology, Oncology   Contact information:   [address] [place] [address] [phone]        The results of significant diagnostics from this hospitalization (including imaging, microbiology, ancillary and laboratory) are listed below for reference.    Significant Diagnostic Studies: Dg Chest 2 View  10/27/2014   CLINICAL DATA:  Short of breath, fever, history of prior right pneumonectomy  EXAM: CHEST  2 VIEW  COMPARISON:  Chest x-ray of 02/16/2014  FINDINGS: Opacification of the entire right hemi thorax is due to prior right pneumonectomy with fibrothorax. Mediastinal shift to the right is noted. The left lung is clear. Heart size is difficult to assess. There are degenerative changes throughout the thoracic spine.  IMPRESSION: Stable changes of right pneumonectomy.  The left lung is clear.   Electronically Signed   By: Ivar Drape M.D.   On: 10/27/2014 11:21   Dg Abd 1 View  10/27/2014   CLINICAL DATA:  Abdominal pain, fever, history of right pneumonectomy  EXAM: ABDOMEN - 1 VIEW  COMPARISON:  CT abdomen pelvis of 12/26/2013 and abdomen plain films of 09/03/2005  FINDINGS: The supine film of the abdomen shows no bowel obstruction. No opaque calculi are seen. 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 There are degenerative changes within the lumbar spine.  IMPRESSION: No bowel obstruction. No opaque calculi. Prior right pneumonectomy.   Electronically Signed   By: Ivar Drape M.D.   On: 10/27/2014 11:20   Ct Chest W Contrast  11/10/2014   CLINICAL DATA:  Restaging metastatic small cell cancer. Currently undergoing chemotherapy. History of remote right lung cancer with pneumonectomy. Subsequent encounter)  EXAM: CT CHEST WITH CONTRAST  TECHNIQUE: Multidetector CT imaging of the chest was performed during  intravenous contrast administration.  CONTRAST:  66m OMNIPAQUE IOHEXOL 300 MG/ML  SOLN  COMPARISON:  Radiographs 10/27/2014. Chest CT 03/08/2012. Abdominal CT 10/27/2014.  FINDINGS: Mediastinum/Nodes: A small right high paratracheal node on image 14 is stable. No pathologically enlarged mediastinal, hilar or axillary lymph nodes are demonstrated. There is stable volume loss in the right hemithorax status post right pneumonectomy. The thyroid gland and esophagus demonstrate no significant findings. There is stable cardiomegaly. No significant pericardial effusion is present.There is extensive atherosclerosis of the aorta, great vessels and coronary arteries. In addition, aortic valvular calcifications are present.  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 There is a small left pleural effusion  which appears simple. The left lung is clear with mild emphysema. There are no suspicious pulmonary nodules.  Upper abdomen: As demonstrated on recent abdominal CT, there is extensive hepatic metastatic disease with multiple ring-enhancing low-density masses. Conglomerate of tumor in the dome of the left hepatic lobe measures 7.4 x 5.8 cm on image 46. Chronic enlargement of the left adrenal gland is unchanged from 2013, consistent with a benign etiology. Gallstones and diffuse vascular calcifications again noted.  Musculoskeletal/Chest wall: Multiple sclerotic lesions are again noted throughout the spine and ribs consistent with metastatic disease. No lytic lesion, pathologic fracture or epidural tumor demonstrated.  IMPRESSION: 1. No evidence of local recurrence within the chest or extra osseous thoracic metastatic disease. 2. Widespread hepatic metastatic disease as demonstrated on recent abdominal CT. 3. Multifocal blastic osseous metastases without pathologic fracture. 4. Diffuse atherosclerosis with aortic valvular calcifications.   Electronically Signed   By: Richardean Sale M.D.    On: 11/10/2014 08:54   Ct Abdomen Pelvis W Contrast  10/27/2014   CLINICAL DATA:  Abdominal swelling, palpable mass or lump in epigastric area, history of prostate cancer, RIGHT pneumonectomy for lung cancer, abnormal LFTs, atrial fibrillation, CHF  EXAM: CT ABDOMEN AND PELVIS WITH CONTRAST  TECHNIQUE: Multidetector CT imaging of the abdomen and pelvis was performed using the standard protocol following bolus administration of intravenous contrast. Sagittal and coronal MPR images reconstructed from axial data set.  CONTRAST:  168m OMNIPAQUE IOHEXOL 300 MG/ML SOLN, 557mOMNIPAQUE IOHEXOL 300 MG/ML SOLN  COMPARISON:  12/26/2013  FINDINGS: Fluid, pleural thickening and pleural calcification at inferior RIGHT hemi thorax post pneumonectomy.  LEFT lung base clear.  Scattered atherosclerotic calcifications aorta, abdominal branch vessels, and coronary arteries.  Numerous poorly defined low-attenuation masses within liver compatible with widespread hepatic metastatic disease.  Largest lesion is at the superior aspect of the LEFT lobe 9.3 x 6.4 cm image 10.  Lateral RIGHT lobe lesion 3.3 x 3.2 cm image 35.  Calcified gallstones within gallbladder.  Spleen, pancreas, kidneys and RIGHT adrenal gland normal.  LEFT adrenal mass 2.1 x 1.8 cm image 19 previously 1.9 x 1.8 cm.  Prostatic enlargement, gland 5.6 x 5.1 cm image 75.  Abnormal soft tissue at the posterior bladder wall lateral to the prostate margins versus previous exam concerning extension of prostate tumor or bladder tumor, new.  Bladder calculus again noted 11 mm diameter.  Stomach and bowel loops grossly unremarkable.  No free air, hernia, or definite adenopathy.  Small amount of perihepatic free intraperitoneal fluid.  Appendix not visualized.  Scattered sclerotic osseous lesions compatible with sclerotic metastases progressive, including spine, pelvis, ribs, and proximal femora.  IMPRESSION: Extensive sclerotic osseous metastatic disease.  Extensive hepatic  metastatic disease.  Little change in size of LEFT adrenal nodule.  Cholelithiasis.  Post surgical changes of RIGHT pneumonectomy.   Electronically Signed   By: MaLavonia Dana.D.   On: 10/27/2014 19:39   Nm Pulmonary Perf And Vent  10/28/2014   CLINICAL DATA:  Dyspnea on exertion.  EXAM: NUCLEAR MEDICINE VENTILATION - PERFUSION LUNG SCAN  TECHNIQUE: Ventilation images were obtained in multiple projections using inhaled aerosol [id number]. Perfusion images were obtained in multiple projections after intravenous injection of Tc-31m81m.  RADIOPHARMACEUTICALS:  42 mCi Technetium-31m 61m aerosol inhalation and 6.2 mCi Technetium-31m M67mV  COMPARISON:  Chest x-ray from yesterday  FINDINGS: No perfusion defects in the solitary left lung to suggest pulmonary embolism. Changes of mild airway turbulence with central radiotracer deposition on ventilation.  Right pneumonectomy.  IMPRESSION: 1. Normal left lung perfusion.  Negative for pulmonary embolism. 2. Right pneumonectomy.   Electronically Signed   By: Monte Fantasia M.D.   On: 10/28/2014 09:18   US Biopsy  10/30/2014   CLINICAL DATA:  Multiple liver lesions suggesting metastatic disease.  EXAM: ULTRASOUND-GUIDED CORE LIVER LESION BIOPSY  TECHNIQUE: An ultrasound guided liver biopsy was thoroughly discussed with the patient and questions were answered. The benefits, risks, alternatives, and complications were also discussed. The patient understands and wishes to proceed with the procedure. A verbal as well as written consent was obtained.  Survey ultrasound of the liver was performed, a representative right lobe lesion localized, and an appropriate skin entry site was determined. Skin site was marked, prepped with chlorhexidine, and draped in usual sterile fashion, and infiltrated locally with 1% lidocaine.  Intravenous Fentanyl and Versed were administered as conscious sedation during continuous cardiorespiratory monitoring by the radiology RN, with a total  moderate sedation time of 5 minutes.  A 17 gauge trocar needle was advanced under ultrasound guidance into the liver to the margin of the lesion. 3 coaxial 18gauge core samples were then obtained through the guide needle. The guide needle was removed. Post procedure scans demonstrate no apparent complication.  COMPLICATIONS: COMPLICATIONS None immediate  FINDINGS: Multiple liver lesions are again confirmed. Ultrasound-guided core biopsy of a representative right lobe lesion obtained without complication.  IMPRESSION: 1. Technically successful ultrasound guided core liver lesion biopsy.   Electronically Signed   By: Lucrezia Europe M.D.   On: 10/30/2014 08:34   Dg Chest Portable 1 View  11/14/2014   CLINICAL DATA:  Short of breath. Fall 3 times today. Right pneumonectomy with bone and hepatic metastasis.  EXAM: PORTABLE CHEST - 1 VIEW  COMPARISON:  CT of 11/09/2014 and plain film 10/27/2014  FINDINGS: Right-sided pneumonectomy, with tracheal deviation to the left. No air within the pneumonectomy space. No left-sided pleural fluid or pneumothorax. Probable mild cardiomegaly. Clear left lung. Numerous leads and wires project over the chest.  IMPRESSION: Status post right pneumonectomy, without acute superimposed process or explanation for shortness of breath.  No posttraumatic deformity identified.   Electronically Signed   By: Abigail Miyamoto M.D.   On: 11/14/2014 20:55    Microbiology: Recent Results (from the past 240 hour(s))  Blood culture (routine x 2)     Status: None (Preliminary result)   Collection Time: 11/14/14  7:50 PM  Result Value Ref Range Status   Specimen Description RIGHT ANTECUBITAL  Final   Special Requests BOTTLES DRAWN AEROBIC ONLY 6CC  Final   Culture NO GROWTH < 12 HOURS  Final   Report Status PENDING  Incomplete  Blood culture (routine x 2)     Status: None (Preliminary result)   Collection Time: 11/14/14  7:56 PM  Result Value Ref Range Status   Specimen Description LEFT ANTECUBITAL   Final   Special Requests BOTTLES DRAWN AEROBIC ONLY [place]  Final   Culture NO GROWTH < 12 HOURS  Final   Report Status PENDING  Incomplete  Urine culture     Status: None (Preliminary result)   Collection Time: 11/14/14 10:40 PM  Result Value Ref Range Status   Specimen Description URINE, CLEAN CATCH  Final   Special Requests Normal  Final   Culture   Final    NO GROWTH < 24 HOURS Performed at [facility]    Report Status PENDING  Incomplete     Labs: Basic Metabolic Panel:  Recent Labs Lab 11/14/14 1941 11/15/14 0557 11/16/14 0602  NA 128* 130* 131*  K 3.9 3.4* 3.2*  CL 86* 90* 92*  CO2 '31 31 31  '$ GLUCOSE 125* 117* 83  BUN 29* 24* 19  CREATININE 1.19 0.95 0.78  CALCIUM 8.4* 7.9* 8.0*   Liver Function Tests:  Recent Labs Lab 11/14/14 1941  AST 35  ALT 33  ALKPHOS 199*  BILITOT 2.3*  PROT 6.0*  ALBUMIN 3.2*   No results for input(s): LIPASE, AMYLASE in the last 168 hours. No results for input(s): AMMONIA in the last 168 hours. CBC:  Recent Labs Lab 11/14/14 1941 11/15/14 0557 11/16/14 0602  WBC 0.7* 1.4* 3.0*  NEUTROABS 0.2*  --   --   HGB 12.3* 10.3* 9.8*  HCT 37.0* 30.8* 28.7*  MCV 82.0 82.1 81.5  PLT 22* 14* 29*   Cardiac Enzymes: No results for input(s): CKTOTAL, CKMB, CKMBINDEX, TROPONINI in the last 168 hours. BNP: BNP (last 3 results) No results for input(s): BNP in the last 8760 hours.  ProBNP (last 3 results) No results for input(s): PROBNP in the last 8760 hours.  CBG: No results for input(s): GLUCAP in the last 168 hours.     SignedLelon Frohlich  [facility] Pager: [phone] 11/16/2014, 12:15 PM

## 2014-11-17 ENCOUNTER — Other Ambulatory Visit (HOSPITAL_COMMUNITY): Payer: Self-pay | Admitting: *Deleted

## 2014-11-17 DIAGNOSIS — C801 Malignant (primary) neoplasm, unspecified: Secondary | ICD-10-CM

## 2014-11-17 LAB — URINE CULTURE
Culture: NO GROWTH
SPECIAL REQUESTS: NORMAL

## 2014-11-18 ENCOUNTER — Other Ambulatory Visit (HOSPITAL_COMMUNITY): Payer: Self-pay | Admitting: *Deleted

## 2014-11-18 ENCOUNTER — Telehealth (HOSPITAL_COMMUNITY): Payer: Self-pay | Admitting: *Deleted

## 2014-11-18 ENCOUNTER — Encounter (HOSPITAL_COMMUNITY): Payer: Self-pay | Admitting: Hematology & Oncology

## 2014-11-18 DIAGNOSIS — C801 Malignant (primary) neoplasm, unspecified: Secondary | ICD-10-CM

## 2014-11-18 MED ORDER — MORPHINE SULFATE (CONCENTRATE) 20 MG/ML PO SOLN: 10.0000 mg | ORAL | Status: AC | PRN

## 2014-11-18 NOTE — Telephone Encounter (Signed)
Pt made Hospice this evening due to patient not wanting to pursue any treatment.

## 2014-11-19 ENCOUNTER — Other Ambulatory Visit (HOSPITAL_COMMUNITY): Payer: Self-pay | Admitting: *Deleted

## 2014-11-19 ENCOUNTER — Ambulatory Visit (HOSPITAL_COMMUNITY): Payer: Medicare Other | Admitting: Oncology

## 2014-11-19 ENCOUNTER — Ambulatory Visit (HOSPITAL_COMMUNITY): Payer: Medicare Other

## 2014-11-19 DIAGNOSIS — C801 Malignant (primary) neoplasm, unspecified: Secondary | ICD-10-CM

## 2014-11-19 LAB — CULTURE, BLOOD (ROUTINE X 2)
CULTURE: NO GROWTH
Culture: NO GROWTH

## 2014-11-19 MED ORDER — FENTANYL 12 MCG/HR TD PT72: 12.5000 ug | MEDICATED_PATCH | TRANSDERMAL | Status: AC

## 2014-11-25 ENCOUNTER — Ambulatory Visit (HOSPITAL_COMMUNITY): Payer: Medicare Other | Admitting: Hematology & Oncology

## 2014-11-25 ENCOUNTER — Ambulatory Visit (HOSPITAL_COMMUNITY): Payer: Medicare Other | Admitting: Oncology

## 2014-11-25 ENCOUNTER — Inpatient Hospital Stay (HOSPITAL_COMMUNITY): Payer: Medicare Other

## 2014-11-26 ENCOUNTER — Inpatient Hospital Stay (HOSPITAL_COMMUNITY): Payer: Medicare Other

## 2014-11-27 ENCOUNTER — Inpatient Hospital Stay (HOSPITAL_COMMUNITY): Payer: Medicare Other

## 2014-11-28 ENCOUNTER — Encounter (HOSPITAL_COMMUNITY): Payer: Self-pay | Admitting: Hematology & Oncology

## 2014-12-09 ENCOUNTER — Ambulatory Visit: Payer: Medicare Other | Admitting: Family Medicine

## 2014-12-14 DEATH — deceased

## 2014-12-16 ENCOUNTER — Inpatient Hospital Stay (HOSPITAL_COMMUNITY): Payer: Medicare Other

## 2014-12-16 ENCOUNTER — Ambulatory Visit (HOSPITAL_COMMUNITY): Payer: Medicare Other | Admitting: Hematology & Oncology

## 2014-12-17 ENCOUNTER — Inpatient Hospital Stay (HOSPITAL_COMMUNITY): Payer: Medicare Other

## 2014-12-18 ENCOUNTER — Inpatient Hospital Stay (HOSPITAL_COMMUNITY): Payer: Medicare Other

## 2015-10-12 ENCOUNTER — Other Ambulatory Visit (HOSPITAL_COMMUNITY): Payer: Self-pay | Admitting: Hematology & Oncology
